# Patient Record
Sex: Male | Born: 1970
Health system: Southern US, Community
[De-identification: ages and names within clinical notes are randomized; demographics above are authoritative.]

## PROBLEM LIST (undated history)

## (undated) DIAGNOSIS — G40909 Epilepsy, unspecified, not intractable, without status epilepticus: Secondary | ICD-10-CM

## (undated) DIAGNOSIS — I10 Essential (primary) hypertension: Secondary | ICD-10-CM

## (undated) DIAGNOSIS — F1990 Other psychoactive substance use, unspecified, uncomplicated: Secondary | ICD-10-CM

## (undated) DIAGNOSIS — F1090 Alcohol use, unspecified, uncomplicated: Secondary | ICD-10-CM

---

## 1999-06-08 ENCOUNTER — Emergency Department (HOSPITAL_COMMUNITY): Admission: EM | Admit: 1999-06-08 | Discharge: 1999-06-08 | Payer: Self-pay | Admitting: Emergency Medicine

## 1999-06-08 ENCOUNTER — Encounter: Payer: Self-pay | Admitting: Emergency Medicine

## 1999-09-11 ENCOUNTER — Emergency Department (HOSPITAL_COMMUNITY): Admission: EM | Admit: 1999-09-11 | Discharge: 1999-09-11 | Payer: Self-pay | Admitting: Emergency Medicine

## 1999-09-11 ENCOUNTER — Encounter: Payer: Self-pay | Admitting: Emergency Medicine

## 2008-01-02 ENCOUNTER — Emergency Department (HOSPITAL_COMMUNITY): Admission: EM | Admit: 2008-01-02 | Discharge: 2008-01-02 | Payer: Self-pay | Admitting: Emergency Medicine

## 2008-01-20 ENCOUNTER — Emergency Department (HOSPITAL_COMMUNITY): Admission: EM | Admit: 2008-01-20 | Discharge: 2008-01-20 | Payer: Self-pay | Admitting: Emergency Medicine

## 2008-01-27 ENCOUNTER — Emergency Department (HOSPITAL_COMMUNITY): Admission: EM | Admit: 2008-01-27 | Discharge: 2008-01-28 | Payer: Self-pay | Admitting: Emergency Medicine

## 2008-02-01 ENCOUNTER — Emergency Department (HOSPITAL_COMMUNITY): Admission: EM | Admit: 2008-02-01 | Discharge: 2008-02-01 | Payer: Self-pay | Admitting: Emergency Medicine

## 2009-10-02 ENCOUNTER — Emergency Department (HOSPITAL_COMMUNITY): Admission: EM | Admit: 2009-10-02 | Discharge: 2009-10-03 | Payer: Self-pay | Admitting: Emergency Medicine

## 2009-10-08 ENCOUNTER — Emergency Department (HOSPITAL_COMMUNITY): Admission: EM | Admit: 2009-10-08 | Discharge: 2009-10-08 | Payer: Self-pay | Admitting: Emergency Medicine

## 2009-10-11 ENCOUNTER — Emergency Department (HOSPITAL_COMMUNITY): Admission: EM | Admit: 2009-10-11 | Discharge: 2009-10-11 | Payer: Self-pay | Admitting: Emergency Medicine

## 2010-11-11 ENCOUNTER — Emergency Department (HOSPITAL_COMMUNITY): Payer: Self-pay

## 2010-11-11 ENCOUNTER — Emergency Department (HOSPITAL_COMMUNITY)
Admission: EM | Admit: 2010-11-11 | Discharge: 2010-11-11 | Disposition: A | Discharge status: Home / Self Care | Payer: Self-pay | Attending: Emergency Medicine | Admitting: Emergency Medicine

## 2010-11-11 DIAGNOSIS — M25559 Pain in unspecified hip: Secondary | ICD-10-CM | POA: Insufficient documentation

## 2010-11-11 DIAGNOSIS — S5000XA Contusion of unspecified elbow, initial encounter: Secondary | ICD-10-CM | POA: Insufficient documentation

## 2010-11-11 DIAGNOSIS — M25529 Pain in unspecified elbow: Secondary | ICD-10-CM | POA: Insufficient documentation

## 2010-11-11 DIAGNOSIS — R079 Chest pain, unspecified: Secondary | ICD-10-CM | POA: Insufficient documentation

## 2010-11-11 DIAGNOSIS — S20219A Contusion of unspecified front wall of thorax, initial encounter: Secondary | ICD-10-CM | POA: Insufficient documentation

## 2010-11-11 DIAGNOSIS — IMO0002 Reserved for concepts with insufficient information to code with codable children: Secondary | ICD-10-CM | POA: Insufficient documentation

## 2010-11-11 DIAGNOSIS — S7000XA Contusion of unspecified hip, initial encounter: Secondary | ICD-10-CM | POA: Insufficient documentation

## 2011-03-07 LAB — POCT I-STAT, CHEM 8
BUN: 14
Calcium, Ion: 1.18
Chloride: 105
Creatinine, Ser: 1.1
Sodium: 138
TCO2: 25

## 2011-03-07 LAB — URINALYSIS, ROUTINE W REFLEX MICROSCOPIC
Ketones, ur: 15 — AB
Leukocytes, UA: NEGATIVE
Nitrite: NEGATIVE
Protein, ur: 30 — AB

## 2017-10-30 DIAGNOSIS — R569 Unspecified convulsions: Secondary | ICD-10-CM | POA: Insufficient documentation

## 2017-11-03 ENCOUNTER — Other Ambulatory Visit: Payer: Self-pay | Admitting: Acute Care

## 2017-11-03 DIAGNOSIS — R569 Unspecified convulsions: Secondary | ICD-10-CM

## 2017-11-13 ENCOUNTER — Other Ambulatory Visit: Payer: Self-pay

## 2022-01-23 ENCOUNTER — Other Ambulatory Visit: Payer: Self-pay

## 2022-01-23 ENCOUNTER — Emergency Department (HOSPITAL_COMMUNITY)
Admission: EM | Admit: 2022-01-23 | Discharge: 2022-01-23 | Disposition: A | Discharge status: Home / Self Care | Payer: Self-pay | Attending: Emergency Medicine | Admitting: Emergency Medicine

## 2022-01-23 ENCOUNTER — Encounter (HOSPITAL_COMMUNITY): Payer: Self-pay

## 2022-01-23 ENCOUNTER — Emergency Department (HOSPITAL_COMMUNITY): Payer: Self-pay

## 2022-01-23 DIAGNOSIS — S80212A Abrasion, left knee, initial encounter: Secondary | ICD-10-CM | POA: Insufficient documentation

## 2022-01-23 DIAGNOSIS — Z5321 Procedure and treatment not carried out due to patient leaving prior to being seen by health care provider: Secondary | ICD-10-CM | POA: Insufficient documentation

## 2022-01-23 DIAGNOSIS — S0181XA Laceration without foreign body of other part of head, initial encounter: Secondary | ICD-10-CM | POA: Insufficient documentation

## 2022-01-23 MED ORDER — TETANUS-DIPHTH-ACELL PERTUSSIS 5-2.5-18.5 LF-MCG/0.5 IM SUSY: 0.5000 mL | PREFILLED_SYRINGE | Freq: Once | INTRAMUSCULAR | Status: DC
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
## 2022-01-23 NOTE — ED Notes (Signed)
Pt calling staff names.
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
## 2022-01-23 NOTE — ED Notes (Signed)
CT come to waiting room to get patient and patient asked for a stretcher saying he cant walk. Explained to patient that we don't have a stretcher to take him to CT so patient gets up and walks to hallway of CT and lays in floor and refuses to get up while verbally abusing staff calling them names. Patient then gets up and says he is leaving.

## 2022-10-16 ENCOUNTER — Other Ambulatory Visit: Payer: Self-pay

## 2022-10-16 ENCOUNTER — Emergency Department (HOSPITAL_COMMUNITY): Payer: 59

## 2022-10-16 ENCOUNTER — Emergency Department (HOSPITAL_COMMUNITY)
Admission: EM | Admit: 2022-10-16 | Discharge: 2022-10-16 | Disposition: A | Discharge status: Home / Self Care | Payer: 59 | Attending: Student in an Organized Health Care Education/Training Program | Admitting: Student in an Organized Health Care Education/Training Program

## 2022-10-16 DIAGNOSIS — S43101A Unspecified dislocation of right acromioclavicular joint, initial encounter: Secondary | ICD-10-CM | POA: Diagnosis not present

## 2022-10-16 DIAGNOSIS — M25511 Pain in right shoulder: Secondary | ICD-10-CM | POA: Insufficient documentation

## 2022-10-16 DIAGNOSIS — R0781 Pleurodynia: Secondary | ICD-10-CM | POA: Diagnosis not present

## 2022-10-16 DIAGNOSIS — S0081XA Abrasion of other part of head, initial encounter: Secondary | ICD-10-CM | POA: Insufficient documentation

## 2022-10-16 DIAGNOSIS — F1010 Alcohol abuse, uncomplicated: Secondary | ICD-10-CM

## 2022-10-16 DIAGNOSIS — S4991XA Unspecified injury of right shoulder and upper arm, initial encounter: Secondary | ICD-10-CM | POA: Diagnosis present

## 2022-10-16 DIAGNOSIS — Y9355 Activity, bike riding: Secondary | ICD-10-CM | POA: Insufficient documentation

## 2022-10-16 DIAGNOSIS — R55 Syncope and collapse: Secondary | ICD-10-CM | POA: Diagnosis not present

## 2022-10-16 DIAGNOSIS — S30811A Abrasion of abdominal wall, initial encounter: Secondary | ICD-10-CM | POA: Diagnosis not present

## 2022-10-16 DIAGNOSIS — S0990XA Unspecified injury of head, initial encounter: Secondary | ICD-10-CM | POA: Insufficient documentation

## 2022-10-16 DIAGNOSIS — W19XXXA Unspecified fall, initial encounter: Secondary | ICD-10-CM

## 2022-10-16 LAB — COMPREHENSIVE METABOLIC PANEL
ALT: 23 U/L (ref 0–44)
AST: 22 U/L (ref 15–41)
Albumin: 4.1 g/dL (ref 3.5–5.0)
Alkaline Phosphatase: 75 U/L (ref 38–126)
Anion gap: 8 (ref 5–15)
BUN: 14 mg/dL (ref 6–20)
CO2: 24 mmol/L (ref 22–32)
Calcium: 9 mg/dL (ref 8.9–10.3)
Chloride: 106 mmol/L (ref 98–111)
Creatinine, Ser: 0.88 mg/dL (ref 0.61–1.24)
GFR, Estimated: >60 mL/min (ref >60)
Glucose, Bld: 123 mg/dL — ABNORMAL HIGH (ref 70–99)
Potassium: 3.9 mmol/L (ref 3.5–5.1)
Sodium: 138 mmol/L (ref 135–145)
Total Bilirubin: 0.5 mg/dL (ref 0.3–1.2)
Total Protein: 7.1 g/dL (ref 6.5–8.1)

## 2022-10-16 LAB — CBC WITH DIFFERENTIAL/PLATELET
Abs Immature Granulocytes: 0.03 10*3/uL (ref 0.00–0.07)
Basophils Absolute: 0.1 10*3/uL (ref 0.0–0.1)
Basophils Relative: 1 %
Eosinophils Absolute: 0.1 10*3/uL (ref 0.0–0.5)
Eosinophils Relative: 1 %
HCT: 43 % (ref 39.0–52.0)
Hemoglobin: 13.9 g/dL (ref 13.0–17.0)
Immature Granulocytes: 0 %
Lymphocytes Relative: 15 %
Lymphs Abs: 1.6 10*3/uL (ref 0.7–4.0)
MCH: 28.5 pg (ref 26.0–34.0)
MCHC: 32.3 g/dL (ref 30.0–36.0)
MCV: 88.1 fL (ref 80.0–100.0)
Monocytes Absolute: 0.9 10*3/uL (ref 0.1–1.0)
Monocytes Relative: 9 %
Neutro Abs: 7.6 10*3/uL (ref 1.7–7.7)
Neutrophils Relative %: 74 %
Platelets: 117 10*3/uL — ABNORMAL LOW (ref 150–400)
RBC: 4.88 MIL/uL (ref 4.22–5.81)
RDW: 13.7 % (ref 11.5–15.5)
WBC: 10.4 10*3/uL (ref 4.0–10.5)
nRBC: 0 % (ref 0.0–0.2)

## 2022-10-16 LAB — LIPASE, BLOOD: Lipase: 42 U/L (ref 11–51)

## 2022-10-16 LAB — URINALYSIS, ROUTINE W REFLEX MICROSCOPIC
Bilirubin Urine: NEGATIVE
Glucose, UA: NEGATIVE mg/dL
Hgb urine dipstick: NEGATIVE
Ketones, ur: NEGATIVE mg/dL
Leukocytes,Ua: NEGATIVE
Nitrite: NEGATIVE
Protein, ur: NEGATIVE mg/dL
Specific Gravity, Urine: 1.017 (ref 1.005–1.030)
pH: 7 (ref 5.0–8.0)

## 2022-10-16 MED ORDER — LACTATED RINGERS IV BOLUS
1000.0000 mL | Freq: Once | INTRAVENOUS | Status: AC
  Administered 2022-10-16: 1000 mL via INTRAVENOUS

## 2022-10-16 MED ORDER — MORPHINE SULFATE (PF) 4 MG/ML IV SOLN
4.0000 mg | Freq: Once | INTRAVENOUS | Status: AC
  Administered 2022-10-16: 4 mg via INTRAVENOUS
  Filled 2022-10-16: qty 1

## 2022-10-16 MED ORDER — KETOROLAC TROMETHAMINE 15 MG/ML IJ SOLN
15.0000 mg | Freq: Once | INTRAMUSCULAR | Status: AC
  Administered 2022-10-16: 15 mg via INTRAVENOUS
  Filled 2022-10-16: qty 1
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
## 2022-10-16 NOTE — Discharge Instructions (Addendum)
Continue wearing the sling until you are cleared by your primary care physician or orthopedic physician.  We have referred you to orthopedic physician.  You can call them to schedule an appointment for reevaluation as needed.  You can take Tylenol for pain.  We recommend avoiding strenuous activity while your shoulder heals.  Return to emergency department if you develop any worsening pain, shortness of breath, persistent abdominal pain, or any other concerning symptoms.

## 2023-01-28 ENCOUNTER — Emergency Department (HOSPITAL_COMMUNITY): Payer: 59

## 2023-01-28 ENCOUNTER — Other Ambulatory Visit: Payer: Self-pay

## 2023-01-28 ENCOUNTER — Emergency Department (HOSPITAL_COMMUNITY)
Admission: EM | Admit: 2023-01-28 | Discharge: 2023-01-29 | Disposition: A | Discharge status: Home / Self Care | Payer: 59 | Attending: Emergency Medicine | Admitting: Emergency Medicine

## 2023-01-28 DIAGNOSIS — R569 Unspecified convulsions: Secondary | ICD-10-CM

## 2023-01-28 DIAGNOSIS — S0012XA Contusion of left eyelid and periocular area, initial encounter: Secondary | ICD-10-CM | POA: Diagnosis not present

## 2023-01-28 DIAGNOSIS — R000 Tachycardia, unspecified: Secondary | ICD-10-CM | POA: Diagnosis not present

## 2023-01-28 DIAGNOSIS — S0591XA Unspecified injury of right eye and orbit, initial encounter: Secondary | ICD-10-CM | POA: Diagnosis present

## 2023-01-28 DIAGNOSIS — S0990XA Unspecified injury of head, initial encounter: Secondary | ICD-10-CM | POA: Diagnosis not present

## 2023-01-28 DIAGNOSIS — S0011XA Contusion of right eyelid and periocular area, initial encounter: Secondary | ICD-10-CM | POA: Insufficient documentation

## 2023-01-28 DIAGNOSIS — R451 Restlessness and agitation: Secondary | ICD-10-CM | POA: Diagnosis not present

## 2023-01-28 LAB — CBC WITH DIFFERENTIAL/PLATELET
Abs Immature Granulocytes: 0.08 10*3/uL — ABNORMAL HIGH (ref 0.00–0.07)
Basophils Absolute: 0.1 10*3/uL (ref 0.0–0.1)
Basophils Relative: 0 %
Eosinophils Absolute: 0 10*3/uL (ref 0.0–0.5)
Eosinophils Relative: 0 %
HCT: 48 % (ref 39.0–52.0)
Hemoglobin: 15.5 g/dL (ref 13.0–17.0)
Immature Granulocytes: 1 %
Lymphocytes Relative: 6 %
Lymphs Abs: 0.8 10*3/uL (ref 0.7–4.0)
MCH: 28.3 pg (ref 26.0–34.0)
MCHC: 32.3 g/dL (ref 30.0–36.0)
MCV: 87.6 fL (ref 80.0–100.0)
Monocytes Absolute: 0.7 10*3/uL (ref 0.1–1.0)
Monocytes Relative: 5 %
Neutro Abs: 12.1 10*3/uL — ABNORMAL HIGH (ref 1.7–7.7)
Neutrophils Relative %: 88 %
Platelets: 120 10*3/uL — ABNORMAL LOW (ref 150–400)
RBC: 5.48 MIL/uL (ref 4.22–5.81)
RDW: 14.5 % (ref 11.5–15.5)
WBC: 13.8 10*3/uL — ABNORMAL HIGH (ref 4.0–10.5)
nRBC: 0 % (ref 0.0–0.2)

## 2023-01-28 LAB — COMPREHENSIVE METABOLIC PANEL
ALT: 21 U/L (ref 0–44)
AST: 30 U/L (ref 15–41)
Albumin: 4.1 g/dL (ref 3.5–5.0)
Alkaline Phosphatase: 120 U/L (ref 38–126)
Anion gap: 16 — ABNORMAL HIGH (ref 5–15)
BUN: 9 mg/dL (ref 6–20)
CO2: 18 mmol/L — ABNORMAL LOW (ref 22–32)
Calcium: 8.9 mg/dL (ref 8.9–10.3)
Chloride: 103 mmol/L (ref 98–111)
Creatinine, Ser: 1.03 mg/dL (ref 0.61–1.24)
GFR, Estimated: >60 mL/min (ref >60)
Glucose, Bld: 135 mg/dL — ABNORMAL HIGH (ref 70–99)
Potassium: 3.5 mmol/L (ref 3.5–5.1)
Sodium: 137 mmol/L (ref 135–145)
Total Bilirubin: 0.8 mg/dL (ref 0.3–1.2)
Total Protein: 7.3 g/dL (ref 6.5–8.1)

## 2023-01-28 LAB — ETHANOL: Alcohol, Ethyl (B): <10 mg/dL (ref <10)

## 2023-01-28 MED ORDER — DIAZEPAM 5 MG/ML IJ SOLN
5.0000 mg | Freq: Once | INTRAMUSCULAR | Status: AC
  Administered 2023-01-28: 5 mg via INTRAVENOUS
  Filled 2023-01-28: qty 2

## 2023-01-28 MED ORDER — DIPHENHYDRAMINE HCL 50 MG/ML IJ SOLN
25.0000 mg | Freq: Once | INTRAMUSCULAR | Status: AC
  Administered 2023-01-28: 25 mg via INTRAVENOUS
  Filled 2023-01-28: qty 1

## 2023-01-28 MED ORDER — HALOPERIDOL LACTATE 5 MG/ML IJ SOLN
5.0000 mg | Freq: Once | INTRAMUSCULAR | Status: AC
  Administered 2023-01-28: 5 mg via INTRAVENOUS
  Filled 2023-01-28: qty 1

## 2023-01-28 MED ORDER — ACETAMINOPHEN 500 MG PO TABS
ORAL_TABLET | ORAL | Status: AC
  Filled 2023-01-28: qty 1
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
## 2023-01-28 NOTE — ED Notes (Signed)
Pt continues to pull off monitoring.  Pt is making purposeful movements and actions.  Pt continues to complain of head pain and nausea.  Pt continues to thrash around the bed.  EDP made aware.

## 2023-01-28 NOTE — Discharge Instructions (Signed)
Please follow-up with your neurologist in regards to recent symptoms and ER visit.  Today your lab workup and imaging were reassuring however you will need to continue taking your seizure medications to prevent future seizures.  If symptoms change or worsen please return to ER.

## 2023-01-28 NOTE — ED Notes (Signed)
PT given 500mg  of Tylenol for severe headache.Medication was overidden from pyxis.

## 2023-01-28 NOTE — ED Notes (Signed)
Patient transported to CT 

## 2023-01-28 NOTE — ED Notes (Addendum)
PT is agitated, attempted to talk to him to calm him down and he yelled. He stated that his head is hurt and he needed tylenol so the PA Schuman ordered 500mg  Tylenol verbally.
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
## 2023-01-28 NOTE — ED Notes (Signed)
CT declined to take PT down because he was agitated and wont stop moving around. They said they will be back later to check to see if he's better

## 2023-01-29 ENCOUNTER — Encounter (HOSPITAL_COMMUNITY): Payer: Self-pay

## 2023-02-03 ENCOUNTER — Other Ambulatory Visit: Payer: Self-pay

## 2023-02-03 ENCOUNTER — Emergency Department (HOSPITAL_COMMUNITY)
Admission: EM | Admit: 2023-02-03 | Discharge: 2023-02-04 | Disposition: A | Discharge status: Home / Self Care | Payer: 59 | Attending: Emergency Medicine | Admitting: Emergency Medicine

## 2023-02-03 ENCOUNTER — Encounter (HOSPITAL_COMMUNITY): Payer: Self-pay

## 2023-02-03 DIAGNOSIS — F1090 Alcohol use, unspecified, uncomplicated: Secondary | ICD-10-CM | POA: Diagnosis not present

## 2023-02-03 DIAGNOSIS — Y906 Blood alcohol level of 120-199 mg/100 ml: Secondary | ICD-10-CM | POA: Diagnosis not present

## 2023-02-03 DIAGNOSIS — Z789 Other specified health status: Secondary | ICD-10-CM

## 2023-02-03 DIAGNOSIS — F419 Anxiety disorder, unspecified: Secondary | ICD-10-CM | POA: Insufficient documentation

## 2023-02-03 DIAGNOSIS — D696 Thrombocytopenia, unspecified: Secondary | ICD-10-CM | POA: Diagnosis not present

## 2023-02-03 DIAGNOSIS — R1011 Right upper quadrant pain: Secondary | ICD-10-CM | POA: Insufficient documentation

## 2023-02-03 MED ORDER — ONDANSETRON 4 MG PO TBDP
4.0000 mg | ORAL_TABLET | Freq: Once | ORAL | Status: AC | PRN
  Administered 2023-02-03: 4 mg via ORAL
  Filled 2023-02-03: qty 1

## 2023-02-03 NOTE — ED Triage Notes (Signed)
Patient arrives via EMS. Patient was found running around [place] stating he needs help. When [employer] arrived patient was complaining of abdominal pain on the right side for 7 days. Reports being constipated but had a bowel movement today. Patient has been taking Miralax.

## 2023-02-04 ENCOUNTER — Emergency Department (HOSPITAL_COMMUNITY): Payer: 59

## 2023-02-04 LAB — CBC
HCT: 44.5 % (ref 39.0–52.0)
Hemoglobin: 14.2 g/dL (ref 13.0–17.0)
MCH: 28 pg (ref 26.0–34.0)
MCHC: 31.9 g/dL (ref 30.0–36.0)
MCV: 87.8 fL (ref 80.0–100.0)
Platelets: 107 10*3/uL — ABNORMAL LOW (ref 150–400)
RBC: 5.07 MIL/uL (ref 4.22–5.81)
RDW: 14.6 % (ref 11.5–15.5)
WBC: 8.4 10*3/uL (ref 4.0–10.5)
nRBC: 0 % (ref 0.0–0.2)

## 2023-02-04 LAB — COMPREHENSIVE METABOLIC PANEL
ALT: 19 U/L (ref 0–44)
AST: 21 U/L (ref 15–41)
Albumin: 3.6 g/dL (ref 3.5–5.0)
Alkaline Phosphatase: 107 U/L (ref 38–126)
Anion gap: 10 (ref 5–15)
BUN: 12 mg/dL (ref 6–20)
CO2: 26 mmol/L (ref 22–32)
Calcium: 8.7 mg/dL — ABNORMAL LOW (ref 8.9–10.3)
Chloride: 106 mmol/L (ref 98–111)
Creatinine, Ser: 1.06 mg/dL (ref 0.61–1.24)
GFR, Estimated: >60 mL/min (ref >60)
Glucose, Bld: 109 mg/dL — ABNORMAL HIGH (ref 70–99)
Potassium: 3.6 mmol/L (ref 3.5–5.1)
Sodium: 142 mmol/L (ref 135–145)
Total Bilirubin: 0.6 mg/dL (ref 0.3–1.2)
Total Protein: 6.5 g/dL (ref 6.5–8.1)

## 2023-02-04 LAB — ETHANOL: Alcohol, Ethyl (B): 131 mg/dL — ABNORMAL HIGH (ref <10)

## 2023-02-04 LAB — LIPASE, BLOOD: Lipase: 40 U/L (ref 11–51)

## 2023-02-04 MED ORDER — FENTANYL CITRATE PF 50 MCG/ML IJ SOSY
50.0000 ug | PREFILLED_SYRINGE | Freq: Once | INTRAMUSCULAR | Status: AC
  Administered 2023-02-04: 50 ug via INTRAVENOUS
  Filled 2023-02-04: qty 1

## 2023-02-04 MED ORDER — KETOROLAC TROMETHAMINE 15 MG/ML IJ SOLN
15.0000 mg | Freq: Once | INTRAMUSCULAR | Status: AC
  Administered 2023-02-04: 15 mg via INTRAMUSCULAR
  Filled 2023-02-04: qty 1

## 2023-02-04 MED ORDER — SODIUM CHLORIDE 0.9 % IV BOLUS (SEPSIS)
1000.0000 mL | Freq: Once | INTRAVENOUS | Status: AC
  Administered 2023-02-04: 1000 mL via INTRAVENOUS

## 2023-02-04 MED ORDER — IOHEXOL 350 MG/ML SOLN
75.0000 mL | Freq: Once | INTRAVENOUS | Status: AC | PRN
  Administered 2023-02-04: 75 mL via INTRAVENOUS

## 2023-02-04 NOTE — ED Notes (Signed)
Urinal given and instructed to use when can void

## 2023-02-04 NOTE — ED Notes (Signed)
Patient transported to CT 

## 2023-02-04 NOTE — Discharge Instructions (Signed)
Abdominal (belly) pain can be caused by many things. any cases can be observed and treated at home after initial evaluation in the emergency department. Even though you are being discharged home, abdominal pain can be unpredictable. Therefore, you need a repeated exam if your pain does not resolve, returns, or worsens. Most patients with abdominal pain don't have to be admitted to the hospital or have surgery, but serious problems like appendicitis and gallbladder attacks can start out as nonspecific pain. Many abdominal conditions cannot be diagnosed in one visit, so follow-up evaluations are very important. °SEEK IMMEDIATE MEDICAL ATTENTION IF: °The pain does not go away or becomes severe, particularly over the next 8-12 hours.  °A temperature above 100.4F develops.  °Repeated vomiting occurs (multiple episodes).  °The pain becomes localized to portions of the abdomen. The right side could possibly be appendicitis. In an adult, the left lower portion of the abdomen could be colitis or diverticulitis.  °Blood is being passed in stools or vomit (bright red or black tarry stools).  °Return also if you develop chest pain, difficulty breathing, dizziness or fainting, or become confused, poorly responsive, or inconsolable. ° °

## 2023-02-04 NOTE — ED Notes (Signed)
Patient transported to Ultrasound 

## 2023-02-04 NOTE — ED Notes (Signed)
Pt making provocative comments towards this Clinical research associate and Kathryne Hitch, Charity fundraiser. Pt was asked to provide a urine sample and did not want to provide on in the restroom. Instead, the pt screams at this writer " I don't need to go the bathroom, I will pee right here!!" And pulls his pants down. Pt was told to pull his pants back up and security was called.

## 2023-02-04 NOTE — ED Provider Triage Note (Signed)
Emergency Medicine Provider Triage Evaluation Note  NYAIR SAMPAIO , a 52 y.o. male  was evaluated in triage.  Pt complains of RUQ pain. States that pain is constant. He has tried stool softeners x 2 w/o relief. Endorses constipation with last BM 2 weeks ago, but cannot tell me if he is passing gas or not. Denies N/V, urinary symptoms. Last ETOH intake was today. Uses methamphetamines and cocaine. No prior abdominal surgeries. Denies trauma.  Review of Systems  Positive: As above Negative: As above  Physical Exam  BP (!) 180/109 (BP Location: Right Arm)   Pulse 61   Temp 98.1 F (36.7 C)   Resp 15   SpO2 97%  Gen:   Awake, no distress   Resp:  Normal effort  MSK:   Moves extremities without difficulty  Other:  Agitated with staff. TTP in the RUQ with neg Murphy's sign. No peritoneal signs on exam.  Medical Decision Making  Medically screening exam initiated at 12:11 AM.  Appropriate orders placed.  Harrington Tatom Pelaez was informed that the remainder of the evaluation will be completed by another provider, this initial triage assessment does not replace that evaluation, and the importance of remaining in the ED until their evaluation is complete.  Abdominal pain - work up initiated.   Antony Madura, PA-C 02/04/23 [phone]

## 2023-02-04 NOTE — ED Provider Notes (Signed)
[place] EMERGENCY DEPARTMENT AT [facility] Provider Note   CSN: [id number] Arrival date & time: 02/03/23  2333     History  Chief Complaint  Patient presents with   Abdominal Pain    Timothy Montgomery is a 52 y.o. male.  The history is provided by the patient.      Patient presents with abdominal pain.  Patient reports has had right upper quadrant abdominal pain for the past 2 days.  He also reports constipation.  No fevers or vomiting.  No active chest pain.  He does not recall having this previously.  No urinary symptoms.  No previous abdominal surgery Patient admits to alcohol use and other substances  Home Medications Prior to Admission medications   Not on File      Allergies    Patient has no known allergies.    Review of Systems   Review of Systems  Constitutional:  Negative for fever.  Gastrointestinal:  Positive for abdominal pain.    Physical Exam Updated Vital Signs BP (!) 157/90 (BP Location: Right Arm)   Pulse 66   Temp 98.3 F (36.8 C) (Oral)   Resp 18   SpO2 100%  Physical Exam CONSTITUTIONAL: Disheveled, smells of alcohol HEAD: Normocephalic/atraumatic EYES: EOMI/PERRL, no icterus ENMT: Mucous membranes moist NECK: supple no meningeal signs CV: S1/S2 noted, no murmurs/rubs/gallops noted LUNGS: Lungs are clear to auscultation bilaterally, no apparent distress ABDOMEN: soft, moderate RUQ tenderness, no rebound or guarding, bowel sounds noted throughout abdomen GU:no cva tenderness NEURO: Pt is awake/alert/appropriate, moves all extremitiesx4.  No facial droop.   EXTREMITIES: pulses normal/equal, full ROM SKIN: warm, color normal PSYCH: Anxious  ED Results / Procedures / Treatments   Labs (all labs ordered are listed, but only abnormal results are displayed) Labs Reviewed  COMPREHENSIVE METABOLIC PANEL - Abnormal; Notable for the following components:      Result Value   Glucose, Bld 109 (*)    Calcium 8.7 (*)    All  other components within normal limits  CBC - Abnormal; Notable for the following components:   Platelets 107 (*)    All other components within normal limits  ETHANOL - Abnormal; Notable for the following components:   Alcohol, Ethyl (B) 131 (*)    All other components within normal limits  LIPASE, BLOOD    EKG EKG Interpretation Date/Time:  Wednesday February 04 2023 03:13:36 EDT Ventricular Rate:  57 PR Interval:  141 QRS Duration:  91 QT Interval:  461 QTC Calculation: 449 R Axis:   68  Text Interpretation: Sinus rhythm Probable left atrial enlargement ST elev, probable normal early repol pattern No previous ECGs available Confirmed by Zadie Rhine ([id number]) on 02/04/2023 3:30:07 AM  Radiology US Abdomen Limited RUQ (LIVER/GB)  Result Date: 02/04/2023 CLINICAL DATA:  Right upper quadrant pain EXAM: ULTRASOUND ABDOMEN LIMITED RIGHT UPPER QUADRANT COMPARISON:  CT from earlier in the same day. FINDINGS: Gallbladder: No gallstones or wall thickening visualized. No sonographic Murphy sign noted by sonographer. Common bile duct: Diameter: 2.8 mm Liver: No focal lesion identified. Within normal limits in parenchymal echogenicity. Portal vein is patent on color Doppler imaging with normal direction of blood flow towards the liver. Other: None. IMPRESSION: No acute abnormality noted. Electronically Signed   By: Alcide Clever M.D.   On: 02/04/2023 03:06   CT ABDOMEN PELVIS W CONTRAST  Result Date: 02/04/2023 CLINICAL DATA:  Right-sided abdominal pain. EXAM: CT ABDOMEN AND PELVIS WITH CONTRAST TECHNIQUE: Multidetector CT imaging of  the abdomen and pelvis was performed using the standard protocol following bolus administration of intravenous contrast. RADIATION DOSE REDUCTION: This exam was performed according to the departmental dose-optimization program which includes automated exposure control, adjustment of the mA and/or kV according to patient size and/or use of iterative reconstruction  technique. CONTRAST:  75mL OMNIPAQUE IOHEXOL 350 MG/ML SOLN COMPARISON:  January 02, 2008 FINDINGS: Lower chest: No acute abnormality. Hepatobiliary: No focal liver abnormality is seen. No gallstones, gallbladder wall thickening, or biliary dilatation. Pancreas: Unremarkable. No pancreatic ductal dilatation or surrounding inflammatory changes. Spleen: A 10 mm diameter cystic appearing structure is seen within an otherwise normal-appearing spleen (axial CT image 14, CT series 3). Adrenals/Urinary Tract: Adrenal glands are unremarkable. Kidneys are normal in size, without renal calculi or hydronephrosis. A 7 mm simple cyst is seen within the posterior aspect of the mid left kidney. Bladder is unremarkable. Stomach/Bowel: Stomach is within normal limits. Appendix appears normal. No evidence of bowel dilatation. The sigmoid colon is moderately thickened and poorly distended. Mild thickening of the distal descending colon is also seen. Vascular/Lymphatic: Aortic atherosclerosis. No enlarged abdominal or pelvic lymph nodes. Reproductive: The prostate gland is mildly enlarged. Other: No abdominal wall hernia or abnormality. No abdominopelvic ascites. Musculoskeletal: No acute or significant osseous findings. IMPRESSION: 1. Thickening of the sigmoid colon and distal descending colon, which may be, in part secondary to underdistention. Colitis within these regions can not be excluded. 2. Small splenic cyst. 3. Small left renal cyst. No follow-up imaging is recommended. This recommendation follows ACR consensus guidelines: Management of the Incidental Renal Mass on CT: A White Paper of the ACR Incidental Findings Committee. J Am Coll Radiol [phone]. 4. Aortic atherosclerosis. Aortic Atherosclerosis (ICD10-I70.0). Electronically Signed   By: Aram Candela M.D.   On: 02/04/2023 02:18    Procedures Procedures    Medications Ordered in ED Medications  ondansetron (ZOFRAN-ODT) disintegrating tablet 4 mg (4 mg Oral  Given 02/03/23 2342)  ketorolac (TORADOL) 15 MG/ML injection 15 mg (15 mg Intramuscular Given 02/04/23 0015)  fentaNYL (SUBLIMAZE) injection 50 mcg (50 mcg Intravenous Given 02/04/23 0229)  sodium chloride 0.9 % bolus 1,000 mL (0 mLs Intravenous Stopped 02/04/23 0304)  iohexol (OMNIPAQUE) 350 MG/ML injection 75 mL (75 mLs Intravenous Contrast Given 02/04/23 0208)    ED Course/ Medical Decision Making/ A&P Clinical Course as of 02/04/23 0413  Wed Feb 04, 2023  0412 Platelets(!): 107 Chronic thrombocytopenia [DW]  [phone] Patient presented for abdominal pain worse in the right quadrant.  Extensive workup has been unrevealing.  Low suspicion for colitis given his history.  Most of his pain was located right upper quadrant.  Patient was also intoxicated but that is improving. Patient is safe for discharge, reports his mother will pick him up [DW]    Clinical Course User Index [DW] Zadie Rhine, MD                                 Medical Decision Making Amount and/or Complexity of Data Reviewed Labs: ordered. Radiology: ordered. ECG/medicine tests: ordered.  Risk Prescription drug management.   This patient presents to the ED for concern of abdominal pain, this involves an extensive number of treatment options, and is a complaint that carries with it a high risk of complications and morbidity.  The differential diagnosis includes but is not limited to cholecystitis, cholelithiasis, pancreatitis, gastritis, peptic ulcer disease, appendicitis, bowel obstruction, bowel perforation, diverticulitis, AAA, ischemic  bowel    Comorbidities that complicate the patient evaluation: Patient's presentation is complicated by their history of alcohol use  Social Determinants of Health: Patient's  substance use disorder   increases the complexity of managing their presentation  Additional history obtained: Records reviewed Care Everywhere/External Records  Lab Tests: I Ordered, and personally  interpreted labs.  The pertinent results include: Chronic thrombocytopenia, alcohol intoxication  Imaging Studies ordered: I ordered imaging studies including CT scan abdomen pelvis and Ultrasound right upper quadrant   I independently visualized and interpreted imaging which showed no acute findings I agree with the radiologist interpretation   Medicines ordered and prescription drug management: I ordered medication including fentanyl for pain Reevaluation of the patient after these medicines showed that the patient    improved  Reevaluation: After the interventions noted above, I reevaluated the patient and found that they have :improved  Complexity of problems addressed: Patient's presentation is most consistent with  acute presentation with potential threat to life or bodily function  Disposition: After consideration of the diagnostic results and the patient's response to treatment,  I feel that the patent would benefit from discharge   .           Final Clinical Impression(s) / ED Diagnoses Final diagnoses:  Right upper quadrant abdominal pain  Alcohol use    Rx / DC Orders ED Discharge Orders     None         Zadie Rhine, MD 02/04/23 [phone]

## 2023-02-06 ENCOUNTER — Other Ambulatory Visit: Payer: Self-pay

## 2023-02-06 ENCOUNTER — Emergency Department (HOSPITAL_COMMUNITY)
Admission: EM | Admit: 2023-02-06 | Discharge: 2023-02-06 | Disposition: A | Discharge status: Home / Self Care | Payer: 59 | Attending: Emergency Medicine | Admitting: Emergency Medicine

## 2023-02-06 ENCOUNTER — Encounter (HOSPITAL_COMMUNITY): Payer: Self-pay

## 2023-02-06 DIAGNOSIS — T7840XA Allergy, unspecified, initial encounter: Secondary | ICD-10-CM | POA: Insufficient documentation

## 2023-02-06 DIAGNOSIS — D72829 Elevated white blood cell count, unspecified: Secondary | ICD-10-CM | POA: Diagnosis not present

## 2023-02-06 LAB — BASIC METABOLIC PANEL
Anion gap: 13 (ref 5–15)
BUN: 19 mg/dL (ref 6–20)
CO2: 22 mmol/L (ref 22–32)
Calcium: 9.2 mg/dL (ref 8.9–10.3)
Chloride: 106 mmol/L (ref 98–111)
Creatinine, Ser: 1.23 mg/dL (ref 0.61–1.24)
GFR, Estimated: >60 mL/min (ref >60)
Glucose, Bld: 108 mg/dL — ABNORMAL HIGH (ref 70–99)
Potassium: 3.6 mmol/L (ref 3.5–5.1)
Sodium: 141 mmol/L (ref 135–145)

## 2023-02-06 LAB — CBC WITH DIFFERENTIAL/PLATELET
Abs Immature Granulocytes: 0.06 10*3/uL (ref 0.00–0.07)
Basophils Absolute: 0.1 10*3/uL (ref 0.0–0.1)
Basophils Relative: 1 %
Eosinophils Absolute: 0.4 10*3/uL (ref 0.0–0.5)
Eosinophils Relative: 4 %
HCT: 48.2 % (ref 39.0–52.0)
Hemoglobin: 15.5 g/dL (ref 13.0–17.0)
Immature Granulocytes: 1 %
Lymphocytes Relative: 26 %
Lymphs Abs: 3 10*3/uL (ref 0.7–4.0)
MCH: 28.5 pg (ref 26.0–34.0)
MCHC: 32.2 g/dL (ref 30.0–36.0)
MCV: 88.8 fL (ref 80.0–100.0)
Monocytes Absolute: 0.9 10*3/uL (ref 0.1–1.0)
Monocytes Relative: 8 %
Neutro Abs: 7.1 10*3/uL (ref 1.7–7.7)
Neutrophils Relative %: 60 %
Platelets: 153 10*3/uL (ref 150–400)
RBC: 5.43 MIL/uL (ref 4.22–5.81)
RDW: 15 % (ref 11.5–15.5)
WBC: 11.6 10*3/uL — ABNORMAL HIGH (ref 4.0–10.5)
nRBC: 0 % (ref 0.0–0.2)

## 2023-02-06 LAB — ETHANOL: Alcohol, Ethyl (B): 183 mg/dL — ABNORMAL HIGH (ref <10)

## 2023-02-06 MED ORDER — EPINEPHRINE 0.3 MG/0.3ML IJ SOAJ: 0.3000 mg | INTRAMUSCULAR | 0 refills | Status: DC | PRN

## 2023-02-06 MED ORDER — PREDNISONE 10 MG PO TABS: 20.0000 mg | ORAL_TABLET | Freq: Every day | ORAL | 0 refills | Status: AC

## 2023-02-06 MED ORDER — FAMOTIDINE 20 MG PO TABS: 20.0000 mg | ORAL_TABLET | Freq: Two times a day (BID) | ORAL | 0 refills | Status: DC

## 2023-02-06 MED ORDER — DIPHENHYDRAMINE HCL 50 MG/ML IJ SOLN
25.0000 mg | Freq: Once | INTRAMUSCULAR | Status: AC
  Administered 2023-02-06: 25 mg via INTRAVENOUS
  Filled 2023-02-06: qty 1

## 2023-02-06 MED ORDER — LORATADINE 10 MG PO TABS: 10.0000 mg | ORAL_TABLET | Freq: Every day | ORAL | 0 refills | Status: DC

## 2023-02-06 MED ORDER — FAMOTIDINE IN NACL 20-0.9 MG/50ML-% IV SOLN
20.0000 mg | Freq: Once | INTRAVENOUS | Status: AC
  Administered 2023-02-06: 20 mg via INTRAVENOUS
  Filled 2023-02-06: qty 50

## 2023-02-06 NOTE — ED Provider Notes (Signed)
[place] EMERGENCY DEPARTMENT AT [facility] Provider Note   CSN: [id number] Arrival date & time: 02/06/23  4098     History  Chief Complaint  Patient presents with   Allergic Reaction    Timothy Montgomery is a 52 y.o. male.  HPI   Patient with medical history including alcohol dependency, presenting with complaints of allergic reaction.  Patient states that he was outside in was laying on the ground and got bit by multiple aunts, he states that he started feel his throat was closing up so he jumped into a pool, he states that he noted bumps on his back which were not itchy, he denies any nausea or vomiting, states he never had allergic reaction to ant bites.  He denies any new medications, no new foods, no new detergents, endorsing any chest pain or shortness of breath, denies any tongue throat lip swelling difficulty breathing currently.  Per EMS patient was given epinephrine around midnight, as well as a DuoNeb.  Home Medications Prior to Admission medications   Medication Sig Start Date End Date Taking? Authorizing Provider  EPINEPHrine (EPIPEN 2-PAK) 0.3 mg/0.3 mL IJ SOAJ injection Inject 0.3 mg into the muscle as needed for anaphylaxis. 02/06/23  Yes Carroll Sage, PA-C  famotidine (PEPCID) 20 MG tablet Take 1 tablet (20 mg total) by mouth 2 (two) times daily for 5 days. 02/06/23 02/11/23 Yes Carroll Sage, PA-C  loratadine (CLARITIN) 10 MG tablet Take 1 tablet (10 mg total) by mouth daily for 5 days. 02/06/23 02/11/23 Yes Carroll Sage, PA-C  predniSONE (DELTASONE) 10 MG tablet Take 2 tablets (20 mg total) by mouth daily for 5 days. 02/06/23 02/11/23 Yes Carroll Sage, PA-C      Allergies    Patient has no known allergies.    Review of Systems   Review of Systems  Constitutional:  Negative for chills and fever.  HENT:  Positive for trouble swallowing.   Respiratory:  Positive for shortness of breath.   Cardiovascular:  Negative for chest  pain.  Gastrointestinal:  Negative for abdominal pain.  Neurological:  Negative for headaches.    Physical Exam Updated Vital Signs BP (!) 151/84   Pulse 76   Temp 98 F (36.7 C) (Oral)   Resp 20   SpO2 100%  Physical Exam Vitals and nursing note reviewed.  Constitutional:      General: He is not in acute distress.    Appearance: He is not ill-appearing.  HENT:     Head: Normocephalic and atraumatic.     Comments: No notable facial edema    Nose: No congestion.     Mouth/Throat:     Mouth: Mucous membranes are moist.     Pharynx: Oropharynx is clear. No oropharyngeal exudate or posterior oropharyngeal erythema.     Comments: No oral edema present, no trismus no torticollis, tongue uvula both midline controlling oral secretions tonsils both equal symmetric bilaterally, no submandibular swelling, no muffled tone voice. Eyes:     Conjunctiva/sclera: Conjunctivae normal.  Cardiovascular:     Rate and Rhythm: Normal rate and regular rhythm.     Pulses: Normal pulses.     Heart sounds: No murmur heard.    No friction rub. No gallop.  Pulmonary:     Effort: No respiratory distress.     Breath sounds: No wheezing, rhonchi or rales.  Skin:    General: Skin is warm and dry.     Comments: Patient has noted bite  marks on his back with surrounding hives, no noted hives on his arms chest or lower extremities.  Neurological:     Mental Status: He is alert.  Psychiatric:        Mood and Affect: Mood normal.     ED Results / Procedures / Treatments   Labs (all labs ordered are listed, but only abnormal results are displayed) Labs Reviewed  BASIC METABOLIC PANEL - Abnormal; Notable for the following components:      Result Value   Glucose, Bld 108 (*)    All other components within normal limits  CBC WITH DIFFERENTIAL/PLATELET - Abnormal; Notable for the following components:   WBC 11.6 (*)    All other components within normal limits  ETHANOL - Abnormal; Notable for the  following components:   Alcohol, Ethyl (B) 183 (*)    All other components within normal limits    EKG EKG Interpretation Date/Time:  Friday February 06 2023 00:38:01 EDT Ventricular Rate:  77 PR Interval:  128 QRS Duration:  86 QT Interval:  398 QTC Calculation: 451 R Axis:   64  Text Interpretation: Sinus rhythm Probable left atrial enlargement Probable left ventricular hypertrophy No significant change was found Confirmed by Drema Pry ([id number]) on 02/06/2023 2:25:43 AM  Radiology No results found.  Procedures Procedures    Medications Ordered in ED Medications  famotidine (PEPCID) IVPB 20 mg premix (0 mg Intravenous Stopped 02/06/23 0213)  diphenhydrAMINE (BENADRYL) injection 25 mg (25 mg Intravenous Given 02/06/23 0118)    ED Course/ Medical Decision Making/ A&P                                 Medical Decision Making Amount and/or Complexity of Data Reviewed Labs: ordered.  Risk OTC drugs. Prescription drug management.   This patient presents to the ED for concern of allergic reaction, this involves an extensive number of treatment options, and is a complaint that carries with it a high risk of complications and morbidity.  The differential diagnosis includes angioedema, anaphylaxis, [place] angina    Additional history obtained:  Additional history obtained from EMS External records from outside source obtained and reviewed including ER notes   Co morbidities that complicate the patient evaluation  Alcohol dependency  Social Determinants of Health:  No PCP    Lab Tests:  I Ordered, and personally interpreted labs.  The pertinent results include: CBC shows leukocytosis 11.6, BMP reveals glucose 106, ethanol 183   Imaging Studies ordered:  I ordered imaging studies including N/A I independently visualized and interpreted imaging which showed N/A I agree with the radiologist interpretation   Cardiac Monitoring:  The patient was maintained on  a cardiac monitor.  I personally viewed and interpreted the cardiac monitored which showed an underlying rhythm of: Without signs of ischemia   Medicines ordered and prescription drug management:  I ordered medication including H1 and H2 blockers I have reviewed the patients home medicines and have made adjustments as needed  Critical Interventions:  N/a   Reevaluation:  After allergic reaction, patient has a benign physical exam, will provide H1 H2 blockers and continue to monitor.  Patient was reassessed resting comfortably, no noted hives, vital signs reassuring, no oral edema, no muffled tone voice, controlling oral secretions, patient is in agreement with discharge at this time  Consultations Obtained:  N/A    Test Considered:  N/A    Rule out Suspicion for angioedema  low at this time which is no oral involvement.  I doubt anaphylaxis presentation atypical, vital signs are reassuring.  Doubt Ludwig angina this is no submandibular swelling    Dispostion and problem list  After consideration of the diagnostic results and the patients response to treatment, I feel that the patent would benefit from discharge.  Allergic reaction-will provide short course of steroids, recommend continue taking H1 and H2 blockers, provide with an EpiPen, can follow-up with an allergist for further assessment.            Final Clinical Impression(s) / ED Diagnoses Final diagnoses:  None    Rx / DC Orders ED Discharge Orders          Ordered    EPINEPHrine (EPIPEN 2-PAK) 0.3 mg/0.3 mL IJ SOAJ injection  As needed        02/06/23 0354    loratadine (CLARITIN) 10 MG tablet  Daily        02/06/23 0354    famotidine (PEPCID) 20 MG tablet  2 times daily        02/06/23 0354    predniSONE (DELTASONE) 10 MG tablet  Daily        02/06/23 0354              Carroll Sage, PA-C 02/06/23 0411    Nira Conn, MD 02/06/23 [phone]

## 2023-02-06 NOTE — ED Triage Notes (Signed)
Pt presents via EMS c/o allergic reaction. Reports bit by several ants. EMS reports pt c/o SOB, lip swelling, "throat closing" per pt report. EMS reports pt wheezing on arrival. 0.3mg  IM Epi, 5mg  Albuterol, 0.5mg  Atrovent given by EMS.

## 2023-02-06 NOTE — ED Notes (Signed)
Water and sandwich tray given per pt request.

## 2023-02-06 NOTE — ED Notes (Signed)
Pt independently ambulated to restroom without difficulty.

## 2023-02-06 NOTE — Discharge Instructions (Addendum)
Possible you had an allergic reaction, I have given you prednisone, Claritin, and Pepcid, please take as prescribed.  I have also given you an EpiPen, please use if you are having allergic reaction where  developed tongue throat lip swelling difficulty breathing, systemic rash.  If you use an EpiPen please call 911 for further assessment  Please follow-up with community health and wellness and/or the allergy clinic for further assessment  For constipation you may take MiraLAX over-the-counter, please remember to drink water with his medications only works if you are hydrated, recommend 1 capful daily, can take up to 3 days until you start to see results, titrate down to frequency and consistency.  Come back to the emergency department if you develop chest pain, shortness of breath, severe abdominal pain, uncontrolled nausea, vomiting, diarrhea.

## 2023-05-19 ENCOUNTER — Emergency Department (HOSPITAL_COMMUNITY): Payer: 59

## 2023-05-19 ENCOUNTER — Other Ambulatory Visit: Payer: Self-pay

## 2023-05-19 ENCOUNTER — Encounter (HOSPITAL_COMMUNITY): Payer: Self-pay

## 2023-05-19 ENCOUNTER — Emergency Department (HOSPITAL_COMMUNITY)
Admission: EM | Admit: 2023-05-19 | Discharge: 2023-05-20 | Disposition: A | Discharge status: Home / Self Care | Payer: 59 | Attending: Emergency Medicine | Admitting: Emergency Medicine

## 2023-05-19 DIAGNOSIS — Z23 Encounter for immunization: Secondary | ICD-10-CM | POA: Diagnosis not present

## 2023-05-19 DIAGNOSIS — T148XXA Other injury of unspecified body region, initial encounter: Secondary | ICD-10-CM | POA: Insufficient documentation

## 2023-05-19 DIAGNOSIS — S0101XA Laceration without foreign body of scalp, initial encounter: Secondary | ICD-10-CM | POA: Insufficient documentation

## 2023-05-19 DIAGNOSIS — S59902A Unspecified injury of left elbow, initial encounter: Secondary | ICD-10-CM | POA: Insufficient documentation

## 2023-05-19 DIAGNOSIS — S0990XA Unspecified injury of head, initial encounter: Secondary | ICD-10-CM | POA: Diagnosis present

## 2023-05-19 DIAGNOSIS — T07XXXA Unspecified multiple injuries, initial encounter: Secondary | ICD-10-CM

## 2023-05-19 DIAGNOSIS — I10 Essential (primary) hypertension: Secondary | ICD-10-CM | POA: Diagnosis not present

## 2023-05-19 HISTORY — DX: Essential (primary) hypertension: I10
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
## 2023-05-20 ENCOUNTER — Emergency Department (HOSPITAL_COMMUNITY): Payer: 59

## 2023-05-20 DIAGNOSIS — S0101XA Laceration without foreign body of scalp, initial encounter: Secondary | ICD-10-CM | POA: Diagnosis not present

## 2023-05-20 MED ORDER — ACETAMINOPHEN 500 MG PO TABS
1000.0000 mg | ORAL_TABLET | Freq: Once | ORAL | Status: AC
  Administered 2023-05-20: 1000 mg via ORAL
  Filled 2023-05-20: qty 2

## 2023-05-20 MED ORDER — TETANUS-DIPHTH-ACELL PERTUSSIS 5-2.5-18.5 LF-MCG/0.5 IM SUSY
0.5000 mL | PREFILLED_SYRINGE | Freq: Once | INTRAMUSCULAR | Status: AC
  Administered 2023-05-20: 0.5 mL via INTRAMUSCULAR
  Filled 2023-05-20: qty 0.5

## 2023-05-20 MED ORDER — LIDOCAINE-EPINEPHRINE (PF) 2 %-1:200000 IJ SOLN
20.0000 mL | Freq: Once | INTRAMUSCULAR | Status: AC
  Administered 2023-05-20: 20 mL via INTRADERMAL
  Filled 2023-05-20: qty 20

## 2023-05-20 MED ORDER — IBUPROFEN 200 MG PO TABS
600.0000 mg | ORAL_TABLET | Freq: Once | ORAL | Status: AC
  Administered 2023-05-20: 600 mg via ORAL
  Filled 2023-05-20: qty 3
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
## 2023-05-20 NOTE — ED Notes (Signed)
Wound irrigated.

## 2023-05-20 NOTE — Discharge Instructions (Signed)
For pain control you may take 1000 mg of acetaminophen (Tylenol) every 8 hours and/or 600 mg of Ibuprofen (Motrin, Advil, etc.) every 6-8 hours as needed.  Please limit acetaminophen (Tylenol) to 4000 mg and Ibuprofen (Motrin, Advil, etc.) to 2400 mg for a 24hr period. Please note that other over-the-counter medicine may contain acetaminophen or ibuprofen as a component of their ingredients.   Do not let your laceration (cut) get wet for the next 48 hours. After that you may allow soapy water to drain down the wound to clean it.  Please do not scrub.  Do not submerge the wound under water for the next 2 weeks.   To minimize scarring, you can apply a vaseline based ointment for the next 2 weeks and keep it out of direct sun light. After that, you may apply sunscreen for the next several months.   Your staple will need to be removed in 5-7 days.   Return if your wound appears to be infected (see laceration care instructions).

## 2023-05-22 ENCOUNTER — Encounter (HOSPITAL_COMMUNITY): Payer: Self-pay

## 2023-05-22 ENCOUNTER — Emergency Department (HOSPITAL_COMMUNITY): Payer: 59

## 2023-05-22 ENCOUNTER — Emergency Department (HOSPITAL_COMMUNITY)
Admission: EM | Admit: 2023-05-22 | Discharge: 2023-05-22 | Disposition: A | Discharge status: Home / Self Care | Payer: 59 | Attending: Emergency Medicine | Admitting: Emergency Medicine

## 2023-05-22 ENCOUNTER — Other Ambulatory Visit: Payer: Self-pay

## 2023-05-22 DIAGNOSIS — I10 Essential (primary) hypertension: Secondary | ICD-10-CM | POA: Diagnosis not present

## 2023-05-22 DIAGNOSIS — S52042A Displaced fracture of coronoid process of left ulna, initial encounter for closed fracture: Secondary | ICD-10-CM | POA: Diagnosis not present

## 2023-05-22 DIAGNOSIS — S53105A Unspecified dislocation of left ulnohumeral joint, initial encounter: Secondary | ICD-10-CM | POA: Diagnosis present

## 2023-05-22 MED ORDER — KETAMINE HCL 50 MG/5ML IJ SOSY
0.5000 mg/kg | PREFILLED_SYRINGE | Freq: Once | INTRAMUSCULAR | Status: AC
  Administered 2023-05-22: 30 mg via INTRAVENOUS
  Filled 2023-05-22: qty 5

## 2023-05-22 MED ORDER — IBUPROFEN 800 MG PO TABS
800.0000 mg | ORAL_TABLET | Freq: Once | ORAL | Status: AC
  Administered 2023-05-22: 800 mg via ORAL
  Filled 2023-05-22: qty 1

## 2023-05-22 MED ORDER — PROPOFOL 10 MG/ML IV BOLUS
0.5000 mg/kg | Freq: Once | INTRAVENOUS | Status: AC
  Administered 2023-05-22: 29.5 mg via INTRAVENOUS
  Filled 2023-05-22: qty 20

## 2023-05-22 NOTE — Progress Notes (Signed)
Orthopedic Tech Progress Note Patient Details:  Timothy Montgomery Jul 06, 1970 [id number]  Ortho Devices Type of Ortho Device: Long arm splint, Arm sling Ortho Device/Splint Location: LUE Ortho Device/Splint Interventions: Application   Post Interventions Patient Tolerated: Well  Timothy Montgomery 05/22/2023, 7:01 PM

## 2023-05-22 NOTE — Sedation Documentation (Signed)
Dr. Suezanne Jacquet, [place], RT, Ladona Ridgel, [place], [place], RN at bedside for procedure

## 2023-05-22 NOTE — ED Provider Triage Note (Signed)
Emergency Medicine Provider Triage Evaluation Note  Timothy Montgomery , a 52 y.o. male  was evaluated in triage.  Pt complains of left elbow/arm pain and swelling.    Review of Systems  Positive: As above Negative: As above  Physical Exam  BP 139/85 (BP Location: Right Arm)   Pulse 89   Temp 98 F (36.7 C) (Oral)   Resp 16   Wt 59 kg   SpO2 98%   BMI 20.99 kg/m  Gen:   Awake, no distress   Resp:  Normal effort  MSK:   Moves extremities without difficulty with exception of left arm - significant swelling to left arm around the elbow; unable to flex his arm or lift it without assistance; radial pulse is 2+. Exquisitely tender to palpation.  Other:    Medical Decision Making  Medically screening exam initiated at 1:05 PM.  Appropriate orders placed.  Timothy Montgomery was informed that the remainder of the evaluation will be completed by another provider, this initial triage assessment does not replace that evaluation, and the importance of remaining in the ED until their evaluation is complete.     Timothy Alar R, PA-C 05/22/23 1309

## 2023-05-22 NOTE — ED Notes (Signed)
Consent form signed by patient.  
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
## 2023-05-22 NOTE — Discharge Instructions (Signed)
We evaluated you for your elbow pain.  Your x-rays show that you have a broken bone in your elbow.  This was a small fracture that was not picked up on your initial ER visit.  We were able to reduce your elbow back into a normal position.  We have placed you in a splint.  Please keep this on at all times as your elbow fracture makes your elbow extremely likely to dislocate again.  It is extremely important also to follow-up with the orthopedic surgeon.  You may need a surgery for this.  Please take Tylenol and Motrin for your symptoms at home.  You can take 1000 mg of Tylenol every 6 hours and 600 mg of ibuprofen every 6 hours as needed for your symptoms.  You can take these medicines together as needed, either at the same time, or alternating every 3 hours.  Please return to the emergency department for any new or worsening symptoms such as recurrent dislocation, severe pain in your arm, numbness or tingling, fevers or chills, or any other concerning symptoms.

## 2023-05-22 NOTE — Progress Notes (Signed)
Conscious sedation:  PT on 2 LPM nasal cannula, Sp02 maintained throughout >=96%.  End tidal ranged 9-16 throughout procedure.  PT awake and appropriate before RT left room with MD and RN consent.

## 2023-05-25 ENCOUNTER — Other Ambulatory Visit: Payer: Self-pay

## 2023-05-25 ENCOUNTER — Emergency Department (HOSPITAL_COMMUNITY)
Admission: EM | Admit: 2023-05-25 | Discharge: 2023-05-25 | Disposition: A | Discharge status: Home / Self Care | Payer: 59 | Attending: Student | Admitting: Student

## 2023-05-25 ENCOUNTER — Encounter (HOSPITAL_COMMUNITY): Payer: Self-pay

## 2023-05-25 DIAGNOSIS — Z4802 Encounter for removal of sutures: Secondary | ICD-10-CM | POA: Insufficient documentation

## 2023-05-25 NOTE — Discharge Instructions (Addendum)
Stable today, the site looks good.  Return to the ER if you feel like you have worsening symptoms, redness, swelling of the area.  Keep the area clean and dry.
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
## 2023-05-25 NOTE — ED Triage Notes (Signed)
1 staple in head 5 days ago. Pt is here for removal.

## 2023-06-19 ENCOUNTER — Encounter: Payer: Self-pay | Admitting: Neurology

## 2023-06-19 ENCOUNTER — Ambulatory Visit (INDEPENDENT_AMBULATORY_CARE_PROVIDER_SITE_OTHER): Payer: Medicaid Other | Admitting: Neurology

## 2023-06-19 VITALS — BP 148/98 | HR 65 | Ht 66.0 in | Wt 142.0 lb

## 2023-06-19 DIAGNOSIS — G40909 Epilepsy, unspecified, not intractable, without status epilepticus: Secondary | ICD-10-CM | POA: Diagnosis not present

## 2023-06-19 MED ORDER — LACOSAMIDE 100 MG PO TABS: 100.0000 mg | ORAL_TABLET | Freq: Two times a day (BID) | ORAL | 5 refills | Status: DC

## 2023-06-19 NOTE — Patient Instructions (Addendum)
 Start Vimpat  100 mg twice daily  Routine EEG, I will contact you to go over the result Please contact me if you have any breakthrough seizure or any side effect from the medication Driving restriction for the next 6 months  Follow-up in 6 months or sooner if worse.
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
## 2023-07-04 ENCOUNTER — Other Ambulatory Visit: Payer: Self-pay

## 2023-07-04 ENCOUNTER — Emergency Department (HOSPITAL_COMMUNITY): Payer: Medicaid Other

## 2023-07-04 ENCOUNTER — Emergency Department (HOSPITAL_COMMUNITY)
Admission: EM | Admit: 2023-07-04 | Discharge: 2023-07-05 | Disposition: A | Discharge status: Home / Self Care | Payer: Medicaid Other | Attending: Emergency Medicine | Admitting: Emergency Medicine

## 2023-07-04 ENCOUNTER — Encounter (HOSPITAL_COMMUNITY): Payer: Self-pay

## 2023-07-04 DIAGNOSIS — K2289 Other specified disease of esophagus: Secondary | ICD-10-CM

## 2023-07-04 DIAGNOSIS — K222 Esophageal obstruction: Secondary | ICD-10-CM | POA: Diagnosis not present

## 2023-07-04 DIAGNOSIS — S2231XA Fracture of one rib, right side, initial encounter for closed fracture: Secondary | ICD-10-CM | POA: Diagnosis not present

## 2023-07-04 DIAGNOSIS — Y92410 Unspecified street and highway as the place of occurrence of the external cause: Secondary | ICD-10-CM | POA: Diagnosis not present

## 2023-07-04 DIAGNOSIS — M546 Pain in thoracic spine: Secondary | ICD-10-CM | POA: Diagnosis present

## 2023-07-04 LAB — I-STAT CHEM 8, ED
BUN: 16 mg/dL (ref 6–20)
Calcium, Ion: 1.2 mmol/L (ref 1.15–1.40)
Chloride: 107 mmol/L (ref 98–111)
Creatinine, Ser: 1.2 mg/dL (ref 0.61–1.24)
Glucose, Bld: 111 mg/dL — ABNORMAL HIGH (ref 70–99)
HCT: 47 % (ref 39.0–52.0)
Hemoglobin: 16 g/dL (ref 13.0–17.0)
Potassium: 3.9 mmol/L (ref 3.5–5.1)
Sodium: 140 mmol/L (ref 135–145)
TCO2: 23 mmol/L (ref 22–32)

## 2023-07-04 MED ORDER — MORPHINE SULFATE (PF) 4 MG/ML IV SOLN
4.0000 mg | Freq: Once | INTRAVENOUS | Status: AC
  Administered 2023-07-04: 4 mg via INTRAVENOUS
  Filled 2023-07-04: qty 1
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
## 2023-07-05 ENCOUNTER — Emergency Department (HOSPITAL_COMMUNITY): Payer: Medicaid Other

## 2023-07-05 DIAGNOSIS — S2231XA Fracture of one rib, right side, initial encounter for closed fracture: Secondary | ICD-10-CM | POA: Diagnosis not present

## 2023-07-05 MED ORDER — IOHEXOL 300 MG/ML  SOLN
100.0000 mL | Freq: Once | INTRAMUSCULAR | Status: AC | PRN
  Administered 2023-07-05: 100 mL via INTRAVENOUS

## 2023-07-05 MED ORDER — HYDROCODONE-ACETAMINOPHEN 5-325 MG PO TABS: 1.0000 | ORAL_TABLET | ORAL | 0 refills | Status: DC | PRN

## 2023-07-05 MED ORDER — LIDOCAINE 5 % EX PTCH: 1.0000 | MEDICATED_PATCH | CUTANEOUS | 0 refills | Status: DC

## 2023-07-05 MED ORDER — LIDOCAINE 5 % EX PTCH
1.0000 | MEDICATED_PATCH | CUTANEOUS | Status: DC
  Administered 2023-07-05: 1 via TRANSDERMAL
  Filled 2023-07-05: qty 1

## 2023-07-05 MED ORDER — KETOROLAC TROMETHAMINE 15 MG/ML IJ SOLN
15.0000 mg | Freq: Once | INTRAMUSCULAR | Status: AC
  Administered 2023-07-05: 15 mg via INTRAVENOUS
  Filled 2023-07-05: qty 1
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
## 2023-07-06 ENCOUNTER — Encounter: Payer: Self-pay | Admitting: Gastroenterology

## 2023-07-15 ENCOUNTER — Other Ambulatory Visit: Payer: Medicaid Other | Admitting: *Deleted

## 2023-07-15 ENCOUNTER — Emergency Department (HOSPITAL_COMMUNITY): Payer: Medicaid Other

## 2023-07-15 ENCOUNTER — Emergency Department (HOSPITAL_COMMUNITY)
Admission: EM | Admit: 2023-07-15 | Discharge: 2023-07-15 | Disposition: A | Discharge status: Home / Self Care | Payer: 59 | Attending: Emergency Medicine | Admitting: Emergency Medicine

## 2023-07-15 ENCOUNTER — Encounter (HOSPITAL_COMMUNITY): Payer: Self-pay

## 2023-07-15 ENCOUNTER — Other Ambulatory Visit: Payer: Self-pay

## 2023-07-15 DIAGNOSIS — S2231XD Fracture of one rib, right side, subsequent encounter for fracture with routine healing: Secondary | ICD-10-CM | POA: Insufficient documentation

## 2023-07-15 DIAGNOSIS — S299XXD Unspecified injury of thorax, subsequent encounter: Secondary | ICD-10-CM | POA: Diagnosis present

## 2023-07-15 DIAGNOSIS — Z79899 Other long term (current) drug therapy: Secondary | ICD-10-CM | POA: Diagnosis not present

## 2023-07-15 MED ORDER — LIDOCAINE 5 % EX PTCH
1.0000 | MEDICATED_PATCH | Freq: Once | CUTANEOUS | Status: DC
  Administered 2023-07-15: 1 via TRANSDERMAL
  Filled 2023-07-15: qty 1

## 2023-07-15 NOTE — Discharge Instructions (Signed)
 If you develop trouble breathing, new or worsening pain, or any other new/concerning symptoms then return to the ER or call 911
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
## 2023-07-28 ENCOUNTER — Emergency Department (HOSPITAL_COMMUNITY): Payer: 59

## 2023-07-28 ENCOUNTER — Other Ambulatory Visit: Payer: Self-pay

## 2023-07-28 ENCOUNTER — Encounter (HOSPITAL_COMMUNITY): Payer: Self-pay

## 2023-07-28 ENCOUNTER — Emergency Department (HOSPITAL_COMMUNITY)
Admission: EM | Admit: 2023-07-28 | Discharge: 2023-07-29 | Disposition: A | Discharge status: Home / Self Care | Payer: 59 | Attending: Emergency Medicine | Admitting: Emergency Medicine

## 2023-07-28 ENCOUNTER — Other Ambulatory Visit: Payer: Medicaid Other | Admitting: *Deleted

## 2023-07-28 DIAGNOSIS — Y92410 Unspecified street and highway as the place of occurrence of the external cause: Secondary | ICD-10-CM | POA: Insufficient documentation

## 2023-07-28 DIAGNOSIS — F10929 Alcohol use, unspecified with intoxication, unspecified: Secondary | ICD-10-CM | POA: Diagnosis not present

## 2023-07-28 DIAGNOSIS — Z79899 Other long term (current) drug therapy: Secondary | ICD-10-CM | POA: Diagnosis not present

## 2023-07-28 DIAGNOSIS — S02842A Fracture of lateral orbital wall, left side, initial encounter for closed fracture: Secondary | ICD-10-CM | POA: Diagnosis not present

## 2023-07-28 DIAGNOSIS — Y908 Blood alcohol level of 240 mg/100 ml or more: Secondary | ICD-10-CM | POA: Diagnosis not present

## 2023-07-28 DIAGNOSIS — S63094A Other dislocation of right wrist and hand, initial encounter: Secondary | ICD-10-CM | POA: Insufficient documentation

## 2023-07-28 DIAGNOSIS — S0240FA Zygomatic fracture, left side, initial encounter for closed fracture: Secondary | ICD-10-CM | POA: Diagnosis not present

## 2023-07-28 DIAGNOSIS — S72009S Fracture of unspecified part of neck of unspecified femur, sequela: Secondary | ICD-10-CM

## 2023-07-28 DIAGNOSIS — S2241XA Multiple fractures of ribs, right side, initial encounter for closed fracture: Secondary | ICD-10-CM | POA: Diagnosis not present

## 2023-07-28 DIAGNOSIS — M25331 Other instability, right wrist: Secondary | ICD-10-CM

## 2023-07-28 DIAGNOSIS — S2249XA Multiple fractures of ribs, unspecified side, initial encounter for closed fracture: Secondary | ICD-10-CM

## 2023-07-28 DIAGNOSIS — Y9355 Activity, bike riding: Secondary | ICD-10-CM | POA: Diagnosis not present

## 2023-07-28 DIAGNOSIS — S0240DA Maxillary fracture, left side, initial encounter for closed fracture: Secondary | ICD-10-CM | POA: Diagnosis not present

## 2023-07-28 DIAGNOSIS — S01112A Laceration without foreign body of left eyelid and periocular area, initial encounter: Secondary | ICD-10-CM | POA: Diagnosis not present

## 2023-07-28 DIAGNOSIS — I10 Essential (primary) hypertension: Secondary | ICD-10-CM | POA: Insufficient documentation

## 2023-07-28 DIAGNOSIS — M25531 Pain in right wrist: Secondary | ICD-10-CM | POA: Diagnosis present

## 2023-07-28 LAB — URINALYSIS, ROUTINE W REFLEX MICROSCOPIC
Bacteria, UA: NONE SEEN
Bilirubin Urine: NEGATIVE
Glucose, UA: NEGATIVE mg/dL
Ketones, ur: NEGATIVE mg/dL
Leukocytes,Ua: NEGATIVE
Nitrite: NEGATIVE
Protein, ur: NEGATIVE mg/dL
Specific Gravity, Urine: 1.026 (ref 1.005–1.030)
pH: 7 (ref 5.0–8.0)

## 2023-07-28 LAB — COMPREHENSIVE METABOLIC PANEL
ALT: 28 U/L (ref 0–44)
AST: 25 U/L (ref 15–41)
Albumin: 4.1 g/dL (ref 3.5–5.0)
Alkaline Phosphatase: 84 U/L (ref 38–126)
Anion gap: 12 (ref 5–15)
BUN: 11 mg/dL (ref 6–20)
CO2: 20 mmol/L — ABNORMAL LOW (ref 22–32)
Calcium: 9.4 mg/dL (ref 8.9–10.3)
Chloride: 108 mmol/L (ref 98–111)
Creatinine, Ser: 1.13 mg/dL (ref 0.61–1.24)
GFR, Estimated: >60 mL/min (ref >60)
Glucose, Bld: 114 mg/dL — ABNORMAL HIGH (ref 70–99)
Potassium: 3.7 mmol/L (ref 3.5–5.1)
Sodium: 140 mmol/L (ref 135–145)
Total Bilirubin: 0.5 mg/dL (ref 0.0–1.2)
Total Protein: 7.3 g/dL (ref 6.5–8.1)

## 2023-07-28 LAB — CBC
HCT: 47.1 % (ref 39.0–52.0)
Hemoglobin: 15.3 g/dL (ref 13.0–17.0)
MCH: 29.7 pg (ref 26.0–34.0)
MCHC: 32.5 g/dL (ref 30.0–36.0)
MCV: 91.5 fL (ref 80.0–100.0)
Platelets: 141 10*3/uL — ABNORMAL LOW (ref 150–400)
RBC: 5.15 MIL/uL (ref 4.22–5.81)
RDW: 13.7 % (ref 11.5–15.5)
WBC: 10.7 10*3/uL — ABNORMAL HIGH (ref 4.0–10.5)
nRBC: 0 % (ref 0.0–0.2)

## 2023-07-28 LAB — PROTIME-INR
INR: 0.9 (ref 0.8–1.2)
Prothrombin Time: 12.6 s (ref 11.4–15.2)

## 2023-07-28 LAB — I-STAT CHEM 8, ED
BUN: 11 mg/dL (ref 6–20)
Calcium, Ion: 1.07 mmol/L — ABNORMAL LOW (ref 1.15–1.40)
Chloride: 109 mmol/L (ref 98–111)
Creatinine, Ser: 1.3 mg/dL — ABNORMAL HIGH (ref 0.61–1.24)
Glucose, Bld: 113 mg/dL — ABNORMAL HIGH (ref 70–99)
HCT: 47 % (ref 39.0–52.0)
Hemoglobin: 16 g/dL (ref 13.0–17.0)
Potassium: 3.6 mmol/L (ref 3.5–5.1)
Sodium: 141 mmol/L (ref 135–145)
TCO2: 21 mmol/L — ABNORMAL LOW (ref 22–32)

## 2023-07-28 LAB — SAMPLE TO BLOOD BANK

## 2023-07-28 LAB — I-STAT CG4 LACTIC ACID, ED: Lactic Acid, Venous: 1.7 mmol/L (ref 0.5–1.9)

## 2023-07-28 LAB — ETHANOL: Alcohol, Ethyl (B): 274 mg/dL — ABNORMAL HIGH (ref <10)

## 2023-07-28 MED ORDER — IOHEXOL 350 MG/ML SOLN
75.0000 mL | Freq: Once | INTRAVENOUS | Status: AC | PRN
  Administered 2023-07-28: 75 mL via INTRAVENOUS

## 2023-07-28 MED ORDER — HYDROMORPHONE HCL 1 MG/ML IJ SOLN
1.0000 mg | Freq: Once | INTRAMUSCULAR | Status: AC
  Administered 2023-07-28: 1 mg via INTRAVENOUS
  Filled 2023-07-28: qty 1
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
## 2023-07-28 NOTE — ED Notes (Signed)
 X-ray at bedside

## 2023-07-28 NOTE — ED Notes (Signed)
Pt stood and urinated in the floor without staff assistance. Pt assisted back in bed, gown placed back on pt and educated pt on not getting up without help and use of the urinal that is present at bedside.
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
## 2023-07-28 NOTE — ED Notes (Signed)
Pt found out of bed urinating in the floor again. Educated pt once again about using call light, not standing without assistance, and the use of the urinal present at bedside.
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
## 2023-07-29 DIAGNOSIS — S0240DA Maxillary fracture, left side, initial encounter for closed fracture: Secondary | ICD-10-CM | POA: Diagnosis not present

## 2023-07-29 MED ORDER — KETOROLAC TROMETHAMINE 15 MG/ML IJ SOLN
15.0000 mg | Freq: Once | INTRAMUSCULAR | Status: AC
  Administered 2023-07-29: 15 mg via INTRAVENOUS
  Filled 2023-07-29: qty 1

## 2023-07-29 MED ORDER — HYDROCODONE-ACETAMINOPHEN 5-325 MG PO TABS
1.0000 | ORAL_TABLET | Freq: Once | ORAL | Status: AC
  Administered 2023-07-29: 1 via ORAL
  Filled 2023-07-29: qty 1

## 2023-07-29 MED ORDER — HYDROCODONE-ACETAMINOPHEN 5-325 MG PO TABS: 2.0000 | ORAL_TABLET | ORAL | 0 refills | Status: DC | PRN

## 2023-07-29 MED ORDER — NAPROXEN 375 MG PO TABS: 375.0000 mg | ORAL_TABLET | Freq: Two times a day (BID) | ORAL | 0 refills | Status: DC

## 2023-07-29 NOTE — ED Notes (Signed)
Pt verbalized understanding of discharge instructions. Pt ambulatory at time of discharge. Pt was able to eat and drink at time of discharge. Pt given bus pass . Pt wheeled from ed.
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
## 2023-07-29 NOTE — ED Notes (Signed)
Assisted pt to use urinal

## 2023-07-30 ENCOUNTER — Other Ambulatory Visit (HOSPITAL_COMMUNITY): Payer: Self-pay | Admitting: Sports Medicine

## 2023-07-30 DIAGNOSIS — S72009S Fracture of unspecified part of neck of unspecified femur, sequela: Secondary | ICD-10-CM
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
## 2023-07-31 ENCOUNTER — Ambulatory Visit (INDEPENDENT_AMBULATORY_CARE_PROVIDER_SITE_OTHER): Payer: 59

## 2023-08-03 ENCOUNTER — Ambulatory Visit: Payer: 59 | Admitting: Gastroenterology

## 2023-08-10 ENCOUNTER — Other Ambulatory Visit: Payer: Medicaid Other | Admitting: *Deleted

## 2023-08-10 ENCOUNTER — Ambulatory Visit (INDEPENDENT_AMBULATORY_CARE_PROVIDER_SITE_OTHER): Payer: 59 | Admitting: Physician Assistant

## 2023-08-10 ENCOUNTER — Encounter: Payer: Self-pay | Admitting: *Deleted

## 2023-08-10 ENCOUNTER — Telehealth: Payer: Self-pay | Admitting: Neurology

## 2023-08-10 ENCOUNTER — Other Ambulatory Visit (INDEPENDENT_AMBULATORY_CARE_PROVIDER_SITE_OTHER)

## 2023-08-10 ENCOUNTER — Encounter: Payer: Self-pay | Admitting: Physician Assistant

## 2023-08-10 VITALS — BP 114/74 | HR 77 | Ht 67.0 in | Wt 138.2 lb

## 2023-08-10 DIAGNOSIS — Z789 Other specified health status: Secondary | ICD-10-CM

## 2023-08-10 DIAGNOSIS — R7989 Other specified abnormal findings of blood chemistry: Secondary | ICD-10-CM

## 2023-08-10 DIAGNOSIS — K2289 Other specified disease of esophagus: Secondary | ICD-10-CM | POA: Diagnosis not present

## 2023-08-10 DIAGNOSIS — K2100 Gastro-esophageal reflux disease with esophagitis, without bleeding: Secondary | ICD-10-CM

## 2023-08-10 DIAGNOSIS — D696 Thrombocytopenia, unspecified: Secondary | ICD-10-CM

## 2023-08-10 DIAGNOSIS — Z1211 Encounter for screening for malignant neoplasm of colon: Secondary | ICD-10-CM

## 2023-08-10 DIAGNOSIS — F1090 Alcohol use, unspecified, uncomplicated: Secondary | ICD-10-CM | POA: Diagnosis not present

## 2023-08-10 DIAGNOSIS — Z5982 Transportation insecurity: Secondary | ICD-10-CM

## 2023-08-10 LAB — IBC + FERRITIN
Ferritin: 111.7 ng/mL (ref 22.0–322.0)
Iron: 86 ug/dL (ref 42–165)
Saturation Ratios: 24.5 % (ref 20.0–50.0)
TIBC: 351.4 ug/dL (ref 250.0–450.0)
Transferrin: 251 mg/dL (ref 212.0–360.0)

## 2023-08-10 LAB — CBC WITH DIFFERENTIAL/PLATELET
Basophils Absolute: 0 10*3/uL (ref 0.0–0.1)
Basophils Relative: 0.4 % (ref 0.0–3.0)
Eosinophils Absolute: 0.4 10*3/uL (ref 0.0–0.7)
Eosinophils Relative: 6.2 % — ABNORMAL HIGH (ref 0.0–5.0)
HCT: 43.9 % (ref 39.0–52.0)
Hemoglobin: 14.8 g/dL (ref 13.0–17.0)
Lymphocytes Relative: 22.9 % (ref 12.0–46.0)
Lymphs Abs: 1.6 10*3/uL (ref 0.7–4.0)
MCHC: 33.8 g/dL (ref 30.0–36.0)
MCV: 90.5 fl (ref 78.0–100.0)
Monocytes Absolute: 0.6 10*3/uL (ref 0.1–1.0)
Monocytes Relative: 8.4 % (ref 3.0–12.0)
Neutro Abs: 4.3 10*3/uL (ref 1.4–7.7)
Neutrophils Relative %: 62.1 % (ref 43.0–77.0)
Platelets: 122 10*3/uL — ABNORMAL LOW (ref 150.0–400.0)
RBC: 4.85 Mil/uL (ref 4.22–5.81)
RDW: 14.5 % (ref 11.5–15.5)
WBC: 6.9 10*3/uL (ref 4.0–10.5)

## 2023-08-10 LAB — BASIC METABOLIC PANEL
BUN: 23 mg/dL (ref 6–23)
CO2: 27 meq/L (ref 19–32)
Calcium: 9.2 mg/dL (ref 8.4–10.5)
Chloride: 105 meq/L (ref 96–112)
Creatinine, Ser: 1.08 mg/dL (ref 0.40–1.50)
GFR: 78.81 mL/min (ref >60.00)
Glucose, Bld: 108 mg/dL — ABNORMAL HIGH (ref 70–99)
Potassium: 3.5 meq/L (ref 3.5–5.1)
Sodium: 140 meq/L (ref 135–145)

## 2023-08-10 LAB — HEPATIC FUNCTION PANEL
ALT: 30 U/L (ref 0–53)
AST: 35 U/L (ref 0–37)
Albumin: 3.9 g/dL (ref 3.5–5.2)
Alkaline Phosphatase: 86 U/L (ref 39–117)
Bilirubin, Direct: 0.2 mg/dL (ref 0.0–0.3)
Total Bilirubin: 0.7 mg/dL (ref 0.2–1.2)
Total Protein: 6.8 g/dL (ref 6.0–8.3)

## 2023-08-10 LAB — PROTIME-INR
INR: 1 ratio (ref 0.8–1.0)
Prothrombin Time: 10.8 s (ref 9.6–13.1)

## 2023-08-10 MED ORDER — PANTOPRAZOLE SODIUM 40 MG PO TBEC
40.0000 mg | DELAYED_RELEASE_TABLET | Freq: Two times a day (BID) | ORAL | 2 refills | Status: DC
Start: 2023-08-10 — End: 2023-08-14

## 2023-08-10 NOTE — Patient Instructions (Addendum)
 Your provider has ordered Cologuard testing as an option for colon cancer screening. This is performed by [employer] and may be out of network with your insurance. PRIOR to completing the test, it is YOUR responsibility to contact your insurance about covered benefits for this test. Your out of pocket expense could be anywhere from $0.00 to $649.00.   When you call to check coverage with your insurer, please provide the following information:   -The ONLY provider of Cologuard is Optician, dispensing  - CPT code for Cologuard is [phone].  [facility] [employer] NPI # [id number]  -[employer] Tax ID # [id number]   We have already sent your demographic and insurance information to [employer] (phone number [phone]) and they should contact you within the next week regarding your test. If you have not heard from them within the next week, please call our office at [phone].    You have been scheduled for an endoscopy. Please follow written instructions given to you at your visit today.  If you use inhalers (even only as needed), please bring them with you on the day of your procedure.  If you take any of the following medications, they will need to be adjusted prior to your procedure:   DO NOT TAKE 7 DAYS PRIOR TO TEST- Trulicity (dulaglutide) Ozempic, Wegovy (semaglutide) Mounjaro (tirzepatide) Bydureon Bcise (exanatide extended release)  DO NOT TAKE 1 DAY PRIOR TO YOUR TEST Rybelsus (semaglutide) Adlyxin (lixisenatide) Victoza (liraglutide) Byetta (exanatide) ___________________________________________________________________________  You have been scheduled for an abdominal ultrasound at [facility] (1st floor of hospital) on         . Please arrive 30 minutes prior to your appointment for registration. Make certain not to have anything to eat or drink 6 hours prior to your appointment. Should you need to reschedule your appointment,  please contact radiology at [phone]. This test typically takes about 30 minutes to perform.  Your provider has requested that you go to the basement level for lab work before leaving today. Press "B" on the elevator. The lab is located at the first door on the left as you exit the elevator.   Please take your proton pump inhibitor medication, pantoprazole  Please take this medication 30 minutes to 1 hour before meals- this makes it more effective.  Avoid spicy and acidic foods Avoid fatty foods Limit your intake of coffee, tea, alcohol, and carbonated drinks Work to maintain a healthy weight Keep the head of the bed elevated at least 3 inches with blocks or a wedge pillow if you are having any nighttime symptoms Stay upright for 2 hours after eating Avoid meals and snacks three to four hours before bedtime   Behavioral Health Resources in the [employer]   Intensive Outpatient Programs:  [facility]  [address]  [place], [place]  [phone]  Both a day and evening program  [facility]  [address]  [place], [place] [address]  [phone]  [facility]  [address]  [place]  [phone]    Residential Treatment Programs:  [facility]  [address]  [place]  [phone]  [facility]  [address], [place] [address]  [place], [place] [address]  [phone]  [facility]  [address] [place], [place] [address]  [phone]  Admissions: 8am-3pm M-F  [facility]  [address]  [place], [place] [address]  [phone]  [facility]  [address] [place]  [place], [place]  [phone]  [facility]  [address]  [place], [place]  [phone]  [facility] - Intensive Outpatient  [address]  [place], [place]  [phone]  [facility] ([facility]  [address]  [place], [place]  [phone] or [phone]  [facility]idential Treatment Services (RT[facility]), Medicaid  [address]  [place], [place]  [phone]  [facility] [address]  [address]  [place]  [phone]   [facility]  ACCESS LINE: [phone] or [phone]  [address]  [place], [place] [address]  [email or web address]   Substance Abuse Resources:  [facility] [phone]  [facility] [phone]  [facility] [place] [phone]  Floydene Flock [phone]  Residential & Outpatient Substance Abuse Program [phone]  Psychological Services:  [facility] [phone]  [facility] [phone]  [place], [place]. [address], [place], ACCESS LINE: [phone] or [phone], [email or web address]  Mobile Crisis Teams:  [facility]  Mobile Crisis Care Unit  [phone]  Assertive  Psychotherapeutic Services  [address] [place]  [phone]   Self-Help/Support Groups:  Mental [facility]oc[facility] of [employer] of support groups  [phone] (call for more info)  Narcotics Anonymous (NA)  [facility]aring [facility]  [address]  [place] - 2 meetings at this location      COLOGUARD INFORMATION  Colon cancer is 3rd most diagnosed cancer and 2nd leading cause of death in both men and women 7 years of age and older despite being one of the most preventable and treatable cancers if found early.  4 of out 5 people diagnosed with colon cancer have NO prior family history.  When caught EARLY 90% of colon cancer is curable.   More than 92% of cologuard patients have NO out of pocket cost for screening however only your insurer can confirm how Cologuard would be covered for you.  Cologuard has a team of specialist that can help you contact your insurer and ask the right questions.  PRIOR to completing the  test, it is YOUR responsibility to contact your insurance about covered benefits for this test. Your out of pocket expense could be anywhere from $0.00 to $649.00.  Please call [phone] so they can help.   When you call to check coverage with your insurer, please provide the following information:   -The ONLY provider of Cologuard is Optician, dispensing  - CPT code for Cologuard is [phone].  [facility] NPI # [id number]  -[employer] Tax ID # [id number]   We have already sent your demographic and insurance information to [employer] (phone number [phone]) and they should contact you within the next week regarding your test. If you have not heard from them within the next week, please call our office at [phone].  You will receive a short call from [employer] Customer support center at [employer], when you receive a call they will say they are from [employer],  to confirm your mailing address and give you more information.  When they calll you, it will appear on the caller ID as "[employer]" or in some cases only this number will appear, [phone].   [employer] will ship your collection kit directly to you. You will collect a single stool sample in the privacy of your own home, no special preparation required. You will return the kit via UPS pre-paid  shipping or pick-up, in the same box it arrived in. Then I will contact you to discuss your results after I receive them from the laboratory.   If you have any questions or concerns, Cologuard Customer Support Specialist are available 24 hours a day, 7 days a week at [phone] or go to [email or web address]       _______________________________________________________  If your blood pressure at your visit was 140/90 or greater, please contact your primary care physician to follow up on this.  _______________________________________________________  If  you are age 36 or older, your body mass index should be between 23-30. Your Body mass index is 21.65 kg/m. If this is out of the aforementioned range listed, please consider follow up with your Primary Care Provider.  If you are age 73 or younger, your body mass index should be between 19-25. Your Body mass index is 21.65 kg/m. If this is out of the aformentioned range listed, please consider follow up with your Primary Care Provider.   ________________________________________________________  The [facility] providers would like to encourage you to use [facility] to communicate with providers for non-urgent requests or questions.  Due to long hold times on the telephone, sending your provider a message by [facility] may be a faster and more efficient way to get a response.  Please allow 48 business hours for a response.  Please remember that this is for non-urgent requests.  _______________________________________________________ It was a pleasure to see you today!  Thank you for trusting me with your gastrointestinal care!

## 2023-08-10 NOTE — Telephone Encounter (Signed)
 Pt called to reschedule appointment due to was not able to keep appointment

## 2023-08-10 NOTE — Progress Notes (Signed)
 08/10/2023 Timothy Montgomery [id number] 04-26-1971  Referring provider: Glendora Score, MD Primary GI doctor: Dr. Barron Alvine  ASSESSMENT AND PLAN:   Esophageal Thickening Noted on CT scan after trauma. Likely secondary to alcohol-induced esophagitis, but need to rule out Barrett's esophagus or malignancy. -Schedule endoscopy , patient has difficult time with securing ride, will schedule when he able to make it. I discussed risks of EGD with patient today, including risk of sedation, bleeding or perforation.  Patient provides understanding and gave verbal consent to proceed. -Advise patient to avoid alcohol and NSAIDs.  Thrombocytopenia previous elevated LFTS in ETOH use Low platelets noted for past 9 months - possible bone marrow suppression from ETOH, CT without evidence of portal hypertension or cirrhosis - will get elastography, hepatitis A, B, C and vaccination status, check iron, ferritin, celiac, ASMA, AMA, IGG, ceruloplasmin -Monitor platelet count and liver function. - advised to stop ETOH use  Alcohol Use Daily liquor consumption, potentially contributing to esophageal thickening and concern for liver damage. -Advise patient to reduce or stop alcohol consumption. -Monitor liver function/CBC - get on MVIT - resources given, discuss with PCP  Colon Cancer Screening Patient is overdue for screening, with no prior colonoscopy or other screening methods used. -patient does not want colonoscopy at this time, prefer to not delay EGD - Long discussion with the patient about cologuard versus colonoscopy without family history, no personal history of polyps and no symptoms at this time will schedule for cologuard, discussed false positives with patient and he understands risk of that and possibly still needing a colonoscopy in the future.  Patient Care Team: Patient, No Pcp Per as PCP - General (General Practice) Patient, No Pcp Per (General Practice)  HISTORY OF PRESENT  ILLNESS:  Discussed the use of AI scribe software for clinical note transcription with the patient, who gave verbal consent to proceed.  History of Present Illness   Timothy Montgomery is a 53 year old male with history of alcohol abuse who presents with esophageal thickening.  Esophageal thickening was identified during a recent hospital visit. He has not previously seen a gastroenterologist and has no history of colonoscopy or endoscopy. Multiple CT scans, including head, cervical spine, chest, abdomen, pelvis, and maxillofacial without contrast, and a CT chest, abdomen, pelvis with contrast, revealed displaced rib fractures and marked diffuse mid to distal esophageal and gastroesophageal junction wall thickening, correlating with signs and symptoms of esophagitis. No evidence of acute intrathoracic, intraabdominal, or intrapelvic trauma or fracture was found. Normal liver, normal spleen, no evidence of portal hypertension.  He experiences occasional reflux or heartburn, particularly when eating excessively, but does not experience it daily and does not take medication for it. No trouble swallowing, food or pills getting caught, or any discomfort. No swelling in his legs or abdomen, yellowing of the eyes or skin, abdominal pain, diarrhea, constipation, blood in the stool, recent weight loss, or night sweats.  He has a history of alcohol use, consuming about half of a fifth of liquor approximately four days a week for the past four years. He quit smoking about five to six years ago and occasionally uses marijuana. He occasionally uses ibuprofen and Tylenol, especially after his recent rib fractures, but has started to reduce his intake. There is no known family history of colon or esophageal cancer.  No history of ETOH withdrawal.    RELEVANT GI HISTORY, IMAGING AND LABS: Results   LABS CBC: Thrombocytopenia  RADIOLOGY CT head (05/27/2023): No acute intrathoracic,  intraabdominal,  intrapelvic trauma or fracture or traumatic alignment of thoracic or lumbar spine. CT cervical spine (05/27/2023): No acute intrathoracic, intraabdominal, intrapelvic trauma or fracture or traumatic alignment of thoracic or lumbar spine. CT chest (05/27/2023): Displaced rib fractures and marked diffuse mid to distal esophageal and gastroesophageal junction wall thickening. No acute intrathoracic trauma. CT abdomen (05/27/2023): Displaced rib fractures and marked diffuse mid to distal esophageal and gastroesophageal junction wall thickening. No acute intraabdominal trauma. CT pelvis (05/27/2023): Displaced rib fractures and marked diffuse mid to distal esophageal and gastroesophageal junction wall thickening. No acute intrapelvic trauma. CT maxillofacial (05/27/2023): No acute intrathoracic, intraabdominal, intrapelvic trauma or fracture or traumatic alignment of thoracic or lumbar spine. CT chest with contrast (05/27/2023): Displaced rib fractures and marked diffuse mid to distal esophageal and gastroesophageal junction wall thickening. No acute intrathoracic trauma. CT abdomen with contrast (05/27/2023): Displaced rib fractures and marked diffuse mid to distal esophageal and gastroesophageal junction wall thickening. No acute intraabdominal trauma. CT pelvis with contrast (05/27/2023): Displaced rib fractures and marked diffuse mid to distal esophageal and gastroesophageal junction wall thickening. No acute intrapelvic trauma. CT abdomen with contrast (02/04/2023): Thickening of the sigmoid colon and distal descending colon, which may be in part secondary to under extension colitis within these regions cannot be excluded.      CBC    Component Value Date/Time   WBC 10.7 (H) 07/28/2023 1822   RBC 5.15 07/28/2023 1822   HGB 16.0 07/28/2023 1831   HCT 47.0 07/28/2023 1831   PLT 141 (L) 07/28/2023 1822   MCV 91.5 07/28/2023 1822   MCH 29.7 07/28/2023 1822   MCHC 32.5 07/28/2023 1822   RDW 13.7  07/28/2023 1822   LYMPHSABS 3.0 02/06/2023 0046   MONOABS 0.9 02/06/2023 0046   EOSABS 0.4 02/06/2023 0046   BASOSABS 0.1 02/06/2023 0046   Recent Labs    10/16/22 0905 01/28/23 1816 02/03/23 2343 02/06/23 0046 07/04/23 2006 07/28/23 1822 07/28/23 1831  HGB 13.9 15.5 14.2 15.5 16.0 15.3 16.0    CMP     Component Value Date/Time   NA 141 07/28/2023 1831   K 3.6 07/28/2023 1831   CL 109 07/28/2023 1831   CO2 20 (L) 07/28/2023 1822   GLUCOSE 113 (H) 07/28/2023 1831   BUN 11 07/28/2023 1831   CREATININE 1.30 (H) 07/28/2023 1831   CALCIUM 9.4 07/28/2023 1822   PROT 7.3 07/28/2023 1822   ALBUMIN 4.1 07/28/2023 1822   AST 25 07/28/2023 1822   ALT 28 07/28/2023 1822   ALKPHOS 84 07/28/2023 1822   BILITOT 0.5 07/28/2023 1822   GFRNONAA >60 07/28/2023 1822      Latest Ref Rng & Units 07/28/2023    6:22 PM 02/03/2023   11:43 PM 01/28/2023    6:16 PM  Hepatic Function  Total Protein 6.5 - 8.1 g/dL 7.3  6.5  7.3   Albumin 3.5 - 5.0 g/dL 4.1  3.6  4.1   AST 15 - 41 U/L 25  21  30    ALT 0 - 44 U/L 28  19  21    Alk Phosphatase 38 - 126 U/L 84  107  120   Total Bilirubin 0.0 - 1.2 mg/dL 0.5  0.6  0.8       Current Medications:    Current Outpatient Medications (Cardiovascular):    amLODipine (NORVASC) 10 MG tablet, Take 10 mg by mouth daily.   amLODipine (NORVASC) 5 MG tablet, Take 5 mg by mouth daily. (Patient not taking: Reported on 07/05/2023)  Current Outpatient Medications (Analgesics):    HYDROcodone-acetaminophen (NORCO/VICODIN) 5-325 MG tablet, Take 1 tablet by mouth every 4 (four) hours as needed. (Patient not taking: Reported on 08/10/2023)   HYDROcodone-acetaminophen (NORCO/VICODIN) 5-325 MG tablet, Take 2 tablets by mouth every 4 (four) hours as needed. (Patient not taking: Reported on 08/10/2023)   naproxen (NAPROSYN) 375 MG tablet, Take 1 tablet (375 mg total) by mouth 2 (two) times daily. (Patient not taking: Reported on 08/10/2023)   Current Outpatient  Medications (Other):    Lacosamide 100 MG TABS, Take 1 tablet (100 mg total) by mouth 2 (two) times daily.   pantoprazole (PROTONIX) 40 MG tablet, Take 1 tablet (40 mg total) by mouth 2 (two) times daily before a meal.   lidocaine (LIDODERM) 5 %, Place 1 patch onto the skin daily. Remove & Discard patch within 12 hours or as directed by MD (Patient not taking: Reported on 08/10/2023)  Medical History:  Past Medical History:  Diagnosis Date   HTN (hypertension)    Allergies:  Allergies  Allergen Reactions   Lisinopril Itching     Surgical History:  He  has no past surgical history on file. Family History:  His family history is not on file.  REVIEW OF SYSTEMS  : All other systems reviewed and negative except where noted in the History of Present Illness.  PHYSICAL EXAM: BP 114/74   Pulse 77   Ht 5\' 7"  (1.702 m)   Wt 138 lb 4 oz (62.7 kg)   BMI 21.65 kg/m  Physical Exam   GENERAL APPEARANCE: thin appearing, in no apparent distress. HEENT: No cervical lymphadenopathy, unremarkable thyroid, sclerae anicteric, conjunctiva pink., poor dentition RESPIRATORY: Respiratory effort normal, breath sounds clear bilaterally without rales, rhonchi, or wheezing. CARDIO: Regular rate and rhythm with no murmurs, rubs, or gallops, peripheral pulses intact. ABDOMEN: Soft, non-distended, active bowel sounds in all four quadrants, non-tender, no rebound, no mass appreciated, no fluid wave. RECTAL: Declines. MUSCULOSKELETAL: Full range of motion, normal gait, arthritis in hands, without edema. SKIN: Dry, intact without rashes or lesions. No jaundice. NEURO: Alert, oriented, no focal deficits.no asterixis. PSYCH: Cooperative, normal mood and affect     Doree Albee, PA-C 2:17 PM

## 2023-08-11 ENCOUNTER — Telehealth: Payer: Self-pay

## 2023-08-11 ENCOUNTER — Other Ambulatory Visit (HOSPITAL_COMMUNITY): Payer: Self-pay

## 2023-08-11 NOTE — Telephone Encounter (Signed)
 Pharmacy Patient Advocate Encounter   Received notification from Fax that prior authorization for Pantoprazole Sodium 40MG  dr tablets is required/requested.   Insurance verification completed.   The patient is insured through [employer] [facility] .   Per test claim: PA required; PA started via CoverMyMeds. KEY [id number] . Waiting for clinical questions to populate.

## 2023-08-12 ENCOUNTER — Encounter: Admitting: Internal Medicine

## 2023-08-13 LAB — IGG: IgG (Immunoglobin G), Serum: 947 mg/dL (ref 600–1640)

## 2023-08-13 LAB — ANTI-SMOOTH MUSCLE ANTIBODY, IGG: Actin (Smooth Muscle) Antibody (IGG): <20 U (ref <20)

## 2023-08-13 LAB — ANTI-NUCLEAR AB-TITER (ANA TITER): ANA Titer 1: 1:40 {titer} — ABNORMAL HIGH

## 2023-08-13 LAB — HEPATITIS A ANTIBODY, TOTAL: Hepatitis A AB,Total: NONREACTIVE

## 2023-08-13 LAB — COPPER, SERUM

## 2023-08-13 LAB — IGA: Immunoglobulin A: 340 mg/dL — ABNORMAL HIGH (ref 47–310)

## 2023-08-13 LAB — ANA: Anti Nuclear Antibody (ANA): POSITIVE — AB

## 2023-08-13 LAB — ALPHA-1-ANTITRYPSIN: A-1 Antitrypsin, Ser: 118 mg/dL (ref 83–199)

## 2023-08-13 LAB — HEPATITIS B SURFACE ANTIBODY,QUALITATIVE: Hep B S Ab: NONREACTIVE

## 2023-08-13 LAB — HEPATITIS B SURFACE ANTIGEN: Hepatitis B Surface Ag: NONREACTIVE

## 2023-08-13 LAB — TISSUE TRANSGLUTAMINASE, IGA: (tTG) Ab, IgA: <1 U/mL

## 2023-08-13 LAB — MITOCHONDRIAL ANTIBODIES: Mitochondrial M2 Ab, IgG: <=20 U (ref <=20.0)

## 2023-08-13 LAB — HEPATITIS C ANTIBODY: Hepatitis C Ab: NONREACTIVE

## 2023-08-14 ENCOUNTER — Ambulatory Visit (AMBULATORY_SURGERY_CENTER): Admitting: Internal Medicine

## 2023-08-14 ENCOUNTER — Encounter: Payer: Self-pay | Admitting: Internal Medicine

## 2023-08-14 VITALS — BP 119/78 | HR 61 | Temp 98.2°F | Resp 18 | Ht 67.0 in | Wt 138.0 lb

## 2023-08-14 DIAGNOSIS — K2100 Gastro-esophageal reflux disease with esophagitis, without bleeding: Secondary | ICD-10-CM | POA: Diagnosis not present

## 2023-08-14 DIAGNOSIS — K259 Gastric ulcer, unspecified as acute or chronic, without hemorrhage or perforation: Secondary | ICD-10-CM | POA: Diagnosis not present

## 2023-08-14 DIAGNOSIS — K269 Duodenal ulcer, unspecified as acute or chronic, without hemorrhage or perforation: Secondary | ICD-10-CM | POA: Diagnosis not present

## 2023-08-14 DIAGNOSIS — K449 Diaphragmatic hernia without obstruction or gangrene: Secondary | ICD-10-CM | POA: Diagnosis not present

## 2023-08-14 DIAGNOSIS — K2970 Gastritis, unspecified, without bleeding: Secondary | ICD-10-CM

## 2023-08-14 DIAGNOSIS — R933 Abnormal findings on diagnostic imaging of other parts of digestive tract: Secondary | ICD-10-CM

## 2023-08-14 DIAGNOSIS — K253 Acute gastric ulcer without hemorrhage or perforation: Secondary | ICD-10-CM

## 2023-08-14 DIAGNOSIS — K219 Gastro-esophageal reflux disease without esophagitis: Secondary | ICD-10-CM

## 2023-08-14 MED ORDER — SODIUM CHLORIDE 0.9 % IV SOLN
500.0000 mL | Freq: Once | INTRAVENOUS | Status: DC
Start: 2023-08-14 — End: 2023-08-14

## 2023-08-14 MED ORDER — OMEPRAZOLE 40 MG PO CPDR
40.0000 mg | DELAYED_RELEASE_CAPSULE | Freq: Every day | ORAL | 3 refills | Status: AC
Start: 2023-08-14 — End: ?

## 2023-08-14 NOTE — Progress Notes (Signed)
 Called to room to assist during endoscopic procedure.  Patient ID and intended procedure confirmed with present staff. Received instructions for my participation in the procedure from the performing physician.

## 2023-08-14 NOTE — Progress Notes (Signed)
 History and Physical Interval Note:  08/14/2023 10:19 AM  Timothy Montgomery  has presented today for endoscopic procedure(s), with the diagnosis of  Encounter Diagnosis  Name Primary?   Gastroesophageal reflux disease with esophagitis, unspecified whether hemorrhage Yes  .  The various methods of evaluation and treatment have been discussed with the patient and/or family. After consideration of risks, benefits and other options for treatment, the patient has consented to  the endoscopic procedure(s).   The patient's history has been reviewed, patient examined, no change in status, stable for endoscopic procedure(s).  I have reviewed the patient's chart and labs.  Questions were answered to the patient's satisfaction.     Iva Boop, MD, [facility]

## 2023-08-14 NOTE — Progress Notes (Signed)
 Pt's states no medical or surgical changes since previsit or office visit.

## 2023-08-14 NOTE — Op Note (Signed)
 [facility] Patient Name: Timothy Montgomery Procedure Date: 08/14/2023 10:20 AM MRN: [id number] Endoscopist: Iva Boop , MD, [id number] Age: 53 Referring MD:  Date of Birth: 1971-06-08 Gender: Male Account #: [id number] Procedure:                Upper GI endoscopy Indications:              Suspected esophageal reflux, Abnormal CT of the GI                            tract Medicines:                Monitored Anesthesia Care Procedure:                Pre-Anesthesia Assessment:                           - Prior to the procedure, a History and Physical                            was performed, and patient medications and                            allergies were reviewed. The patient's tolerance of                            previous anesthesia was also reviewed. The risks                            and benefits of the procedure and the sedation                            options and risks were discussed with the patient.                            All questions were answered, and informed consent                            was obtained. Prior Anticoagulants: The patient has                            taken no anticoagulant or antiplatelet agents. ASA                            Grade Assessment: II - A patient with mild systemic                            disease. After reviewing the risks and benefits,                            the patient was deemed in satisfactory condition to                            undergo the procedure.  After obtaining informed consent, the endoscope was                            passed under direct vision. Throughout the                            procedure, the patient's blood pressure, pulse, and                            oxygen saturations were monitored continuously. The                            Olympus Scope SN [id number] was introduced through the                            mouth, and advanced to the second part of  duodenum.                            The upper GI endoscopy was accomplished without                            difficulty. The patient tolerated the procedure                            well. Scope In: Scope Out: Findings:                 Two non-bleeding cratered duodenal ulcers with no                            stigmata of bleeding were found in the duodenal                            bulb and in the second portion of the duodenum. The                            largest lesion was 10 mm in largest dimension.                           One non-bleeding cratered gastric ulcer with no                            stigmata of bleeding was found in the gastric                            antrum. The lesion was 4 mm in largest dimension.                           Diffuse severe inflammation characterized by                            congestion (edema) and erosions was found in the  entire examined stomach. Biopsies were taken with a                            cold forceps for histology. Verification of patient                            identification for the specimen was done. Estimated                            blood loss was minimal.                           A small hiatal hernia was present.                           The gastroesophageal flap valve was visualized                            endoscopically and classified as Hill Grade IV (no                            fold, wide open lumen, hiatal hernia present).                           The cardia and gastric fundus were otherwise normal                            on retroflexion.                           The exam was otherwise without abnormality. Complications:            No immediate complications. Estimated Blood Loss:     Estimated blood loss was minimal. Impression:               - Non-bleeding duodenal ulcers with no stigmata of                            bleeding.                           -  Non-bleeding gastric ulcer with no stigmata of                            bleeding.                           - Gastritis. Biopsied.                           - Small hiatal hernia.                           - Gastroesophageal flap valve classified as Hill                            Grade IV (no fold, wide  open lumen, hiatal hernia                            present).                           - The examination was otherwise normal. Recommendation:           - Patient has a contact number available for                            emergencies. The signs and symptoms of potential                            delayed complications were discussed with the                            patient. Return to normal activities tomorrow.                            Written discharge instructions were provided to the                            patient.                           - Resume previous diet.                           - Start omeprazole 40 mg daily - Ihe has a                            pantoprazole rx pending prior autorization so will                            try omeprazole instead and advised get OTC 20 mg x                            2 if cannot get 40 mg Rx                           wait pathology                           Stop alcohol and no NSAIDS/ASA if using - he did                            have Naprosyn on list from February so this could                            be causative - was not taking per med                            reconmciliation - I dced from list Iva Boop, MD 08/14/2023 10:56:19 AM This report has been signed electronically.

## 2023-08-14 NOTE — Progress Notes (Signed)
 Vss nad trans to pacu

## 2023-08-14 NOTE — Patient Instructions (Addendum)
 The esophagus looks fine.  You have multiple ulcers in the upper intestine (duodenum) + significant irritation in the stomach with one tiny ulcer. You are at risk of having severe bleeding if not treated.  I took biopsies to understand why but alcohol and anti-inflammatory meds like aspirin, Advil, ibuprofen, Goody's, CBC powders, aleve cand do this also. Stopping alcohol as advised before and avoiding these medications is important so do that.  Quentin Mulling had prescribed pantoprazole on 3/3 but hat is hung up in a prior authorization - I have sent an omeprazole prescription to [facility] instead.  PLEASE GET THAT TODAY AND START IT. IF TOO EXPENSIVE YOU CAN BUY OVER THE COUNTER OMEPRAZOLE 20 MG AND TAKE 2 A DAY  Will let you know biopsy results and other plans.  I appreciate the opportunity to care for you. Iva Boop, MD, [facility]   Handout provided on gastritis and hiatal hernia.  Resume previous diet.  Continue present medications, including the above mentioned Omeprazole 40mg  daily.   YOU HAD AN ENDOSCOPIC PROCEDURE TODAY AT THE [facility]:   Refer to the procedure report that was given to you for any specific questions about what was found during the examination.  If the procedure report does not answer your questions, please call your gastroenterologist to clarify.  If you requested that your care partner not be given the details of your procedure findings, then the procedure report has been included in a sealed envelope for you to review at your convenience later.  YOU SHOULD EXPECT: Some feelings of bloating in the abdomen. Passage of more gas than usual.  Walking can help get rid of the air that was put into your GI tract during the procedure and reduce the bloating. If you had a lower endoscopy (such as a colonoscopy or flexible sigmoidoscopy) you may notice spotting of blood in your stool or on the toilet paper. If you underwent a bowel prep for your procedure, you  may not have a normal bowel movement for a few days.  Please Note:  You might notice some irritation and congestion in your nose or some drainage.  This is from the oxygen used during your procedure.  There is no need for concern and it should clear up in a day or so.  SYMPTOMS TO REPORT IMMEDIATELY:  Following upper endoscopy (EGD)  Vomiting of blood or coffee ground material  New chest pain or pain under the shoulder blades  Painful or persistently difficult swallowing  New shortness of breath  Fever of 100F or higher  Black, tarry-looking stools  For urgent or emergent issues, a gastroenterologist can be reached at any hour by calling [phone]. Do not use MyChart messaging for urgent concerns.    DIET:  We do recommend a small meal at first, but then you may proceed to your regular diet.  Drink plenty of fluids but you should avoid alcoholic beverages for 24 hours.  ACTIVITY:  You should plan to take it easy for the rest of today and you should NOT DRIVE or use heavy machinery until tomorrow (because of the sedation medicines used during the test).    FOLLOW UP: Our staff will call the number listed on your records the next business day following your procedure.  We will call around 7:15- 8:00 am to check on you and address any questions or concerns that you may have regarding the information given to you following your procedure. If we do not reach you, we  will leave a message.     If any biopsies were taken you will be contacted by phone or by letter within the next 1-3 weeks.  Please call us at [phone] if you have not heard about the biopsies in 3 weeks.    SIGNATURES/CONFIDENTIALITY: You and/or your care partner have signed paperwork which will be entered into your electronic medical record.  These signatures attest to the fact that that the information above on your After Visit Summary has been reviewed and is understood.  Full responsibility of the confidentiality  of this discharge information lies with you and/or your care-partner.

## 2023-08-17 ENCOUNTER — Telehealth: Payer: Self-pay | Admitting: Pharmacy Technician

## 2023-08-17 ENCOUNTER — Telehealth: Payer: Self-pay

## 2023-08-17 ENCOUNTER — Other Ambulatory Visit (HOSPITAL_COMMUNITY): Payer: Self-pay

## 2023-08-17 NOTE — Telephone Encounter (Signed)
 Pharmacy Patient Advocate Encounter   Received notification from CoverMyMeds that prior authorization for PANTOPRAZOLE 40MG  is required/requested.   Insurance verification completed.   The patient is insured through [employer] [facility] .   Per test claim: PA required; PA submitted to above mentioned insurance via CoverMyMeds Key/confirmation #/EOC [id number] Status is pending

## 2023-08-17 NOTE — Telephone Encounter (Signed)
 Left message on answering machine.

## 2023-08-19 ENCOUNTER — Encounter: Payer: Self-pay | Admitting: *Deleted

## 2023-08-19 ENCOUNTER — Ambulatory Visit: Admitting: Neurology

## 2023-08-19 DIAGNOSIS — G40909 Epilepsy, unspecified, not intractable, without status epilepticus: Secondary | ICD-10-CM | POA: Diagnosis not present

## 2023-08-19 LAB — SURGICAL PATHOLOGY

## 2023-08-19 NOTE — Procedures (Signed)
   History:  53 year old man with seizure   EEG classification:  Awake and asleep  Duration: 25 minutes   Technical aspects: This EEG study was done with scalp electrodes positioned according to the 10-20 International system of electrode placement. Electrical activity was reviewed with band pass filter of 1-70Hz , sensitivity of 7 uV/mm, display speed of 68mm/sec with a 60Hz  notched filter applied as appropriate. EEG data were recorded continuously and digitally stored.   Description of the recording: The background rhythms of this recording consists of low amplitude with no clear PDR. As the record progresses, the patient initially is in the waking state, but appears to enter the early stage II sleep during the recording, with rudimentary sleep spindles and vertex sharp wave activity seen. During the wakeful state, photic stimulation was performed, and no abnormal responses were seen. Hyperventilation was also performed, no abnormal response seen. No epileptiform discharges seen during this recording. There was no focal slowing.   Abnormality: None   Impression: This is a normal awake and sleep EEG. No evidence of interictal epileptiform discharges. Normal EEGs, however, do not rule out epilepsy.    Windell Norfolk, MD [facility]

## 2023-08-21 ENCOUNTER — Telehealth: Payer: Self-pay | Admitting: Anesthesiology

## 2023-08-21 NOTE — Telephone Encounter (Signed)
 Called pt and left a voicemail to return our call regarding EEG results.

## 2023-08-21 NOTE — Progress Notes (Signed)
Please call and inform patient that his recent EEG (Brain wave test) was normal. In particular, there were no epileptiform discharges and no seizures. No further action is required on this test at this time. Please keep any upcoming appointments or tests and  call us with any interim questions, concerns, problems or updates. Thanks,   Emmie Frakes, MD 

## 2023-08-21 NOTE — Telephone Encounter (Signed)
-----   Message from [facility] sent at 08/21/2023 10:48 AM EDT ----- Please call and inform patient that his recent EEG (Brain wave test) was normal. In particular, there were no epileptiform discharges and no seizures. No further action is required on this test at this time. Please keep any upcoming appointments or tests and  call us with any interim questions, concerns, problems or updates. Thanks,   Windell Norfolk, MD

## 2023-08-26 NOTE — Telephone Encounter (Signed)
 Pharmacy Patient Advocate Encounter  Received notification from [facility] that Prior Authorization for PANTOPRAZOLE 40MG  has been DENIED.  Full denial letter will be uploaded to the media tab. See denial reason below.  Criteria for medical necessity were not met. Your records with [place] and the information submitted by your doctor do not show you meet the following criteria: (1) trial and failure for at least one month or unable to use two of the following: lansoprazole, omeprazole, and pantoprazole, (2) your doctor's explanation why you need a quantity of 60 tablets per 30 days (Please note: the plan's quantity limit is 30 tablets per 30 days), (3) the prescribed dose, frequency, and treatment duration are consistent with one of the following: a) Food and Drug Administration (FDA)-approved labeling, b) nationally recognized compendia (a collection of clinical data and evidence), c) clinical practice guidelines from nationally recognized professional associations or governmental agencies, or d) peer-reviewed medical literature from a major scientific or medical publication, (4) documentation the requested dose or frequency cannot be achieved using a different strength or formulation covered by the plan's quantity limits, (5) documentation of one of the following: a) trial and failure for at least three months of the requested drug within the plan's quantity limit (once daily dosing),b) requirement of a higher dose due to clinical circumstances (such as variations in weight or metabolism or disease severity), or c) condition being treated requires a higher dose than the plan's quantity limit, (6) documentation of evidence supporting the safety and effectiveness of the higher dose being requested [for requests exceeding FDA max only], and (7) the requested medication is being used for a Food and Drug Administration (FDA)- approved or compendia (a collection of clinical data and  evidence)-supported indication.  PA #/Case ID/Reference #:  [id number]

## 2023-09-01 ENCOUNTER — Emergency Department (HOSPITAL_COMMUNITY)
Admission: EM | Admit: 2023-09-01 | Discharge: 2023-09-02 | Disposition: A | Discharge status: Home / Self Care | Attending: Emergency Medicine | Admitting: Emergency Medicine

## 2023-09-01 ENCOUNTER — Other Ambulatory Visit: Payer: Self-pay

## 2023-09-01 DIAGNOSIS — I10 Essential (primary) hypertension: Secondary | ICD-10-CM | POA: Diagnosis not present

## 2023-09-01 DIAGNOSIS — Z79899 Other long term (current) drug therapy: Secondary | ICD-10-CM | POA: Insufficient documentation

## 2023-09-01 DIAGNOSIS — R569 Unspecified convulsions: Secondary | ICD-10-CM | POA: Diagnosis present

## 2023-09-01 LAB — RAPID URINE DRUG SCREEN, HOSP PERFORMED
Amphetamines: POSITIVE — AB
Barbiturates: NOT DETECTED
Benzodiazepines: NOT DETECTED
Cocaine: NOT DETECTED
Opiates: NOT DETECTED
Tetrahydrocannabinol: POSITIVE — AB

## 2023-09-01 LAB — CBC WITH DIFFERENTIAL/PLATELET
Abs Immature Granulocytes: 0.07 10*3/uL (ref 0.00–0.07)
Basophils Absolute: 0.1 10*3/uL (ref 0.0–0.1)
Basophils Relative: 1 %
Eosinophils Absolute: 0.5 10*3/uL (ref 0.0–0.5)
Eosinophils Relative: 5 %
HCT: 41.9 % (ref 39.0–52.0)
Hemoglobin: 13.8 g/dL (ref 13.0–17.0)
Immature Granulocytes: 1 %
Lymphocytes Relative: 14 %
Lymphs Abs: 1.3 10*3/uL (ref 0.7–4.0)
MCH: 29.9 pg (ref 26.0–34.0)
MCHC: 32.9 g/dL (ref 30.0–36.0)
MCV: 90.7 fL (ref 80.0–100.0)
Monocytes Absolute: 0.8 10*3/uL (ref 0.1–1.0)
Monocytes Relative: 8 %
Neutro Abs: 6.7 10*3/uL (ref 1.7–7.7)
Neutrophils Relative %: 71 %
Platelets: 131 10*3/uL — ABNORMAL LOW (ref 150–400)
RBC: 4.62 MIL/uL (ref 4.22–5.81)
RDW: 13.8 % (ref 11.5–15.5)
WBC: 9.3 10*3/uL (ref 4.0–10.5)
nRBC: 0 % (ref 0.0–0.2)

## 2023-09-01 LAB — COMPREHENSIVE METABOLIC PANEL
ALT: 26 U/L (ref 0–44)
AST: 27 U/L (ref 15–41)
Albumin: 3.6 g/dL (ref 3.5–5.0)
Alkaline Phosphatase: 85 U/L (ref 38–126)
Anion gap: 12 (ref 5–15)
BUN: 20 mg/dL (ref 6–20)
CO2: 19 mmol/L — ABNORMAL LOW (ref 22–32)
Calcium: 9 mg/dL (ref 8.9–10.3)
Chloride: 107 mmol/L (ref 98–111)
Creatinine, Ser: 1.29 mg/dL — ABNORMAL HIGH (ref 0.61–1.24)
GFR, Estimated: >60 mL/min (ref >60)
Glucose, Bld: 91 mg/dL (ref 70–99)
Potassium: 3.5 mmol/L (ref 3.5–5.1)
Sodium: 138 mmol/L (ref 135–145)
Total Bilirubin: 0.5 mg/dL (ref 0.0–1.2)
Total Protein: 6.4 g/dL — ABNORMAL LOW (ref 6.5–8.1)

## 2023-09-01 LAB — URINALYSIS, ROUTINE W REFLEX MICROSCOPIC
Bacteria, UA: NONE SEEN
Bilirubin Urine: NEGATIVE
Glucose, UA: NEGATIVE mg/dL
Hgb urine dipstick: NEGATIVE
Ketones, ur: NEGATIVE mg/dL
Leukocytes,Ua: NEGATIVE
Nitrite: NEGATIVE
Protein, ur: 30 mg/dL — AB
Specific Gravity, Urine: 1.018 (ref 1.005–1.030)
pH: 5 (ref 5.0–8.0)

## 2023-09-01 LAB — ETHANOL: Alcohol, Ethyl (B): <10 mg/dL (ref <10)

## 2023-09-01 LAB — MAGNESIUM: Magnesium: 2.2 mg/dL (ref 1.7–2.4)

## 2023-09-01 MED ORDER — SODIUM CHLORIDE 0.9 % IV SOLN
200.0000 mg | INTRAVENOUS | Status: AC
  Administered 2023-09-01: 200 mg via INTRAVENOUS
  Filled 2023-09-01: qty 20

## 2023-09-01 MED ORDER — LACOSAMIDE 100 MG PO TABS: 100.0000 mg | ORAL_TABLET | Freq: Two times a day (BID) | ORAL | 0 refills | Status: DC

## 2023-09-01 NOTE — Discharge Instructions (Signed)
 You were seen for your seizure in the emergency department.   At home, please continue to take the seizure medication that you have been prescribed.  Refrain from activities that could be dangerous when you have a seizure such as climbing a ladder or swimming.  Do not drive for at least 6 months.  Talk to your neurologist to see when it is safe to resume these activities.    Check your MyChart online for the results of any tests that had not resulted by the time you left the emergency department.   Follow-up with your primary doctor in 2-3 days regarding your visit.  Follow-up with your neurologist in 1 week.  Return immediately to the emergency department if you experience any of the following: Seizures lasting more than 5 minutes, or any other concerning symptoms.    Thank you for visiting our Emergency Department. It was a pleasure taking care of you today.

## 2023-09-01 NOTE — ED Provider Notes (Signed)
 [place] EMERGENCY DEPARTMENT AT [facility] Provider Note   CSN: [id number] Arrival date & time: 09/01/23  2031     History  Chief Complaint  Patient presents with   Seizures    Patient brought in by [facility] EMS. Per EMS patient was at work [employer]) and found slumped over a desk breathing heavy. Postictal with medic. Patient stated that he has been out of his seizure medications for a while. No meds administered with medic. Hx drug and alcohol abuse 20 LAC a/o x3    Timothy Montgomery is a 53 y.o. male.  53 year old male with history of hypertension and possible seizures (undergoing workup) on lacosamide who presents emergency department after being found down.  Patient was working at [employer].  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  There was some mention that he may have had a seizure but no one actually witnessed the patient shaking.  They are unsure if he overdosed on something or had another event.  Patient does not recall what happened.  Denies any recreational drug use aside from occasional alcohol and marijuana.  Says that he has not had any heavy alcohol use recently.  Has been out of his lacosamide 100 twice daily for several days.  Was confused for EMS.  Since his last seizure was approximately 4 months ago.       Home Medications Prior to Admission medications   Medication Sig Start Date End Date Taking? Authorizing Provider  amLODipine (NORVASC) 10 MG tablet Take 10 mg by mouth daily. 06/23/23   [provider]  Lacosamide 100 MG TABS Take 1 tablet (100 mg total) by mouth 2 (two) times daily. 09/01/23 02/28/24  Rondel Baton, MD  omeprazole (PRILOSEC) 40 MG capsule Take 1 capsule (40 mg total) by mouth daily before breakfast. 08/14/23   Iva Boop, MD      Allergies    Lisinopril    Review of Systems   Review of Systems  Physical Exam Updated Vital Signs BP  (!) 111/59 (BP Location: Right Arm)   Pulse 76   Temp (!) 97.5 F (36.4 C) (Oral)   Resp 16   SpO2 98%  Physical Exam Vitals and nursing note reviewed.  Constitutional:      General: He is not in acute distress.    Appearance: He is well-developed.  HENT:     Head: Normocephalic and atraumatic.     Right Ear: External ear normal.     Left Ear: External ear normal.     Nose: Nose normal.     Mouth/Throat:     Mouth: Mucous membranes are moist.     Pharynx: Oropharynx is clear.  Eyes:     Extraocular Movements: Extraocular movements intact.     Conjunctiva/sclera: Conjunctivae normal.     Pupils: Pupils are equal, round, and reactive to light.  Neck:     Comments: No C-spine midline tenderness palpation Cardiovascular:     Rate and Rhythm: Normal rate and regular rhythm.     Heart sounds: Normal heart sounds.  Pulmonary:     Effort: Pulmonary effort is normal. No respiratory distress.     Breath sounds: Normal breath sounds.  Musculoskeletal:     Cervical back: Normal range of motion and neck supple.     Right lower leg: No edema.     Left lower leg: No edema.  Skin:    General:  Skin is warm and dry.  Neurological:     General: No focal deficit present.     Mental Status: He is alert and oriented to person, place, and time. Mental status is at baseline.     Cranial Nerves: No cranial nerve deficit.     Sensory: No sensory deficit.     Motor: No weakness.  Psychiatric:        Mood and Affect: Mood normal.        Behavior: Behavior normal.     ED Results / Procedures / Treatments   Labs (all labs ordered are listed, but only abnormal results are displayed) Labs Reviewed  COMPREHENSIVE METABOLIC PANEL - Abnormal; Notable for the following components:      Result Value   CO2 19 (*)    Creatinine, Ser 1.29 (*)    Total Protein 6.4 (*)    All other components within normal limits  CBC WITH DIFFERENTIAL/PLATELET - Abnormal; Notable for the following components:    Platelets 131 (*)    All other components within normal limits  URINALYSIS, ROUTINE W REFLEX MICROSCOPIC - Abnormal; Notable for the following components:   APPearance HAZY (*)    Protein, ur 30 (*)    All other components within normal limits  RAPID URINE DRUG SCREEN, HOSP PERFORMED - Abnormal; Notable for the following components:   Amphetamines POSITIVE (*)    Tetrahydrocannabinol POSITIVE (*)    All other components within normal limits  MAGNESIUM  ETHANOL  CBG MONITORING, ED    EKG EKG Interpretation Date/Time:  Tuesday September 01 2023 21:37:04 EDT Ventricular Rate:  68 PR Interval:  140 QRS Duration:  96 QT Interval:  420 QTC Calculation: 447 R Axis:   52  Text Interpretation: Sinus rhythm Confirmed by Vanetta Mulders [phone]) on 09/01/2023 9:44:36 PM  Radiology No results found.  Procedures Procedures    Medications Ordered in ED Medications  lacosamide (VIMPAT) 200 mg in sodium chloride 0.9 % 25 mL IVPB (200 mg Intravenous New Bag/Given 09/01/23 2143)    ED Course/ Medical Decision Making/ A&P                                 Medical Decision Making Amount and/or Complexity of Data Reviewed Labs: ordered.  Risk Prescription drug management.   Timothy Montgomery is a 53 y.o. male with comorbidities that complicate the patient evaluation including hypertension and possible seizures (undergoing workup) on lacosamide who presents emergency department after being found down.    Initial Ddx:  Seizure, syncope, overdose, TBI  MDM/Course:  Patient presents emergency department due to concerns for seizure-like activity.  EMS reported that he may have been postictal today as well.  Does report to me that he has a history of seizures but appears that he is undergoing a workup for this.  Last EEG was normal.  Was started on lacosamide by neurology because of his concerning history.  Overall well-appearing on exam.  No signs of traumatic brain injury or head injury.   Has a nonfocal neuroexam.  EKG without concerning findings such as Brugada, long QT, or WPW that would have caused his loss of consciousness.  Has already had to have an imaging that has not showed any abnormalities to explain seizures.  Labs without significant electrolyte abnormalities that would cause seizures.  Urine drug screen without opiates but did show that he had some amphetamines in his urine.  Patient  loaded with IV Vimpat per pharmacy recommendation. Upon re-evaluation back to baseline. Vimpat sent to his pharmacy.  Explained to him that he should not drive for 6 months.  Will have him follow-up with neurology as well.    This patient presents to the ED for concern of complaints listed in HPI, this involves an extensive number of treatment options, and is a complaint that carries with it a high risk of complications and morbidity. Disposition including potential need for admission considered.   Dispo: DC Home. Return precautions discussed including, but not limited to, those listed in the AVS. Allowed pt time to ask questions which were answered fully prior to dc.  Additional history obtained from EMS Records reviewed Outpatient Clinic Notes The following labs were independently interpreted: Chemistry and show no acute abnormality I personally reviewed and interpreted cardiac monitoring: normal sinus rhythm  I personally reviewed and interpreted the pt's EKG: see above for interpretation  I have reviewed the patients home medications and made adjustments as needed  Portions of this note were generated with Dragon dictation software. Dictation errors may occur despite best attempts at proofreading.     Final Clinical Impression(s) / ED Diagnoses Final diagnoses:  Seizure-like activity (HCC)    Rx / DC Orders ED Discharge Orders          Ordered    Ambulatory referral to Neurology       Comments: An appointment is requested in approximately: 1 week   09/01/23 2226     Lacosamide 100 MG TABS  2 times daily        09/01/23 2228              Rondel Baton, MD 09/01/23 2240

## 2023-09-02 NOTE — ED Notes (Signed)
 Patient d/c with home care instructions. Patient called mom to get him a cab to get home

## 2023-09-03 ENCOUNTER — Other Ambulatory Visit: Payer: Self-pay | Admitting: Neurology

## 2023-09-03 ENCOUNTER — Telehealth: Payer: Self-pay | Admitting: Neurology

## 2023-09-03 ENCOUNTER — Telehealth: Payer: Self-pay

## 2023-09-03 MED ORDER — LACOSAMIDE 100 MG PO TABS
100.0000 mg | ORAL_TABLET | Freq: Two times a day (BID) | ORAL | 5 refills | Status: DC
Start: 2023-09-03 — End: 2023-09-10

## 2023-09-03 NOTE — Telephone Encounter (Signed)
 Juliana @[facility]  has called for a refill for pt's Lacosamide 100 MG TABS  to [facility]#[phone]  [facility]

## 2023-09-03 NOTE — Telephone Encounter (Signed)
error 

## 2023-09-03 NOTE — Telephone Encounter (Signed)
 Last seen 08/19/23, next appt 12/31/23 Dispenses   Dispensed Days Supply Quantity Provider Pharmacy  LACOSAMIDE 100MG     TAB 07/23/2023 30 60 each Windell Norfolk, MD [facility] [phone] ...  LACOSAMIDE 100MG     TAB 06/23/2023 30 60 each Windell Norfolk, MD [facility] [phone] .Marland KitchenMarland Kitchen

## 2023-09-04 ENCOUNTER — Telehealth: Payer: Self-pay

## 2023-09-04 ENCOUNTER — Other Ambulatory Visit (HOSPITAL_COMMUNITY): Payer: Self-pay

## 2023-09-04 NOTE — Telephone Encounter (Signed)
 Pharmacy Patient Advocate Encounter   Received notification from Fax that prior authorization for Lacosamide 100MG  tablets is required/requested.   Insurance verification completed.   The patient is insured through [facility] .   Per test claim: PA required; PA submitted to above mentioned insurance via CoverMyMeds Key/confirmation #/EOC [id number] Status is pending

## 2023-09-10 ENCOUNTER — Other Ambulatory Visit: Payer: Self-pay | Admitting: Neurology

## 2023-09-10 MED ORDER — LEVETIRACETAM 500 MG PO TABS: 500.0000 mg | ORAL_TABLET | Freq: Two times a day (BID) | ORAL | 3 refills | Status: AC

## 2023-09-10 NOTE — Telephone Encounter (Signed)
 Pharmacy Patient Advocate Encounter  Received notification from [facility] that Prior Authorization for Lacosamide 100MG  tablets has been DENIED.  Full denial letter will be uploaded to the media tab. See denial reason below.    PA #/Case ID/Reference #: PA Case ID #: [id number]

## 2023-09-10 NOTE — Telephone Encounter (Signed)
 LMTRC 1ST ATTEMPT BY HF UPON CALLBACK PLEASE RELAY THE FOLLOWING MSG;  Lacosamide not covered, we will try patient on Levetiracetam. Please advise patient to take 1/2 table twice daily for 2 weeks then increase to full tablet twice daily. Thanks

## 2023-09-10 NOTE — Telephone Encounter (Signed)
 Lacosamide not covered, we will try patient on Levetiracetam. Please advise patient to take 1/2 table twice daily for 2 weeks then increase to full tablet twice daily. Thanks

## 2023-09-11 NOTE — Telephone Encounter (Signed)
 LMTRC 2nd  ATTEMPT BY HF UPON CALLBACK PLEASE RELAY THE FOLLOWING MSG;   Lacosamide not covered, we will try patient on Levetiracetam. Please advise patient to take 1/2 table twice daily for 2 weeks then increase to full tablet twice daily. Thank

## 2023-09-14 NOTE — Telephone Encounter (Signed)
 Lmtrc 3rd and final attempt

## 2023-09-16 ENCOUNTER — Ambulatory Visit: Payer: 59 | Admitting: Physician Assistant

## 2023-09-18 ENCOUNTER — Institutional Professional Consult (permissible substitution): Admitting: Neurology

## 2023-10-29 ENCOUNTER — Telehealth: Payer: Self-pay

## 2023-10-29 NOTE — Telephone Encounter (Signed)
 Notified by AON that unable to get in touch with patient, mom said patient had a new number but unable to provide it. Mom number is [phone]. AON updated

## 2023-11-09 ENCOUNTER — Emergency Department (HOSPITAL_COMMUNITY): Payer: MEDICAID

## 2023-11-09 ENCOUNTER — Other Ambulatory Visit (HOSPITAL_COMMUNITY): Payer: Self-pay

## 2023-11-09 ENCOUNTER — Inpatient Hospital Stay (HOSPITAL_COMMUNITY)
Admission: EM | Admit: 2023-11-09 | Discharge: 2023-11-25 | DRG: 896 | Disposition: A | Discharge status: Home / Self Care | Payer: MEDICAID | Attending: Internal Medicine | Admitting: Internal Medicine

## 2023-11-09 ENCOUNTER — Other Ambulatory Visit: Payer: Self-pay

## 2023-11-09 DIAGNOSIS — F10239 Alcohol dependence with withdrawal, unspecified: Secondary | ICD-10-CM | POA: Diagnosis not present

## 2023-11-09 DIAGNOSIS — F1510 Other stimulant abuse, uncomplicated: Secondary | ICD-10-CM | POA: Diagnosis present

## 2023-11-09 DIAGNOSIS — R339 Retention of urine, unspecified: Secondary | ICD-10-CM | POA: Diagnosis not present

## 2023-11-09 DIAGNOSIS — G928 Other toxic encephalopathy: Secondary | ICD-10-CM | POA: Diagnosis present

## 2023-11-09 DIAGNOSIS — F05 Delirium due to known physiological condition: Secondary | ICD-10-CM | POA: Diagnosis not present

## 2023-11-09 DIAGNOSIS — E876 Hypokalemia: Secondary | ICD-10-CM | POA: Diagnosis present

## 2023-11-09 DIAGNOSIS — Z7401 Bed confinement status: Secondary | ICD-10-CM

## 2023-11-09 DIAGNOSIS — D696 Thrombocytopenia, unspecified: Secondary | ICD-10-CM | POA: Diagnosis not present

## 2023-11-09 DIAGNOSIS — G40909 Epilepsy, unspecified, not intractable, without status epilepticus: Secondary | ICD-10-CM | POA: Diagnosis present

## 2023-11-09 DIAGNOSIS — I5031 Acute diastolic (congestive) heart failure: Secondary | ICD-10-CM | POA: Diagnosis not present

## 2023-11-09 DIAGNOSIS — R32 Unspecified urinary incontinence: Secondary | ICD-10-CM | POA: Diagnosis present

## 2023-11-09 DIAGNOSIS — F1590 Other stimulant use, unspecified, uncomplicated: Secondary | ICD-10-CM | POA: Diagnosis not present

## 2023-11-09 DIAGNOSIS — F1090 Alcohol use, unspecified, uncomplicated: Secondary | ICD-10-CM | POA: Diagnosis not present

## 2023-11-09 DIAGNOSIS — F1210 Cannabis abuse, uncomplicated: Secondary | ICD-10-CM | POA: Diagnosis present

## 2023-11-09 DIAGNOSIS — I10 Essential (primary) hypertension: Secondary | ICD-10-CM | POA: Diagnosis present

## 2023-11-09 DIAGNOSIS — I959 Hypotension, unspecified: Secondary | ICD-10-CM | POA: Diagnosis not present

## 2023-11-09 DIAGNOSIS — F10231 Alcohol dependence with withdrawal delirium: Principal | ICD-10-CM | POA: Diagnosis present

## 2023-11-09 DIAGNOSIS — R410 Disorientation, unspecified: Secondary | ICD-10-CM | POA: Diagnosis not present

## 2023-11-09 DIAGNOSIS — I160 Hypertensive urgency: Secondary | ICD-10-CM | POA: Diagnosis present

## 2023-11-09 DIAGNOSIS — E7220 Disorder of urea cycle metabolism, unspecified: Secondary | ICD-10-CM | POA: Diagnosis not present

## 2023-11-09 DIAGNOSIS — F10939 Alcohol use, unspecified with withdrawal, unspecified: Secondary | ICD-10-CM | POA: Diagnosis present

## 2023-11-09 DIAGNOSIS — L03114 Cellulitis of left upper limb: Secondary | ICD-10-CM | POA: Diagnosis present

## 2023-11-09 DIAGNOSIS — J690 Pneumonitis due to inhalation of food and vomit: Secondary | ICD-10-CM | POA: Diagnosis not present

## 2023-11-09 DIAGNOSIS — R569 Unspecified convulsions: Secondary | ICD-10-CM

## 2023-11-09 DIAGNOSIS — E785 Hyperlipidemia, unspecified: Secondary | ICD-10-CM | POA: Diagnosis present

## 2023-11-09 DIAGNOSIS — R52 Pain, unspecified: Secondary | ICD-10-CM | POA: Diagnosis not present

## 2023-11-09 DIAGNOSIS — Z781 Physical restraint status: Secondary | ICD-10-CM

## 2023-11-09 DIAGNOSIS — R443 Hallucinations, unspecified: Secondary | ICD-10-CM | POA: Diagnosis not present

## 2023-11-09 DIAGNOSIS — D6959 Other secondary thrombocytopenia: Secondary | ICD-10-CM | POA: Diagnosis present

## 2023-11-09 DIAGNOSIS — F1110 Opioid abuse, uncomplicated: Secondary | ICD-10-CM | POA: Diagnosis present

## 2023-11-09 DIAGNOSIS — I739 Peripheral vascular disease, unspecified: Secondary | ICD-10-CM | POA: Diagnosis not present

## 2023-11-09 DIAGNOSIS — M7989 Other specified soft tissue disorders: Secondary | ICD-10-CM | POA: Diagnosis not present

## 2023-11-09 DIAGNOSIS — R1310 Dysphagia, unspecified: Secondary | ICD-10-CM | POA: Diagnosis present

## 2023-11-09 DIAGNOSIS — G9340 Encephalopathy, unspecified: Secondary | ICD-10-CM | POA: Diagnosis not present

## 2023-11-09 DIAGNOSIS — I82612 Acute embolism and thrombosis of superficial veins of left upper extremity: Secondary | ICD-10-CM | POA: Diagnosis present

## 2023-11-09 DIAGNOSIS — R4182 Altered mental status, unspecified: Secondary | ICD-10-CM | POA: Diagnosis present

## 2023-11-09 DIAGNOSIS — G9341 Metabolic encephalopathy: Secondary | ICD-10-CM | POA: Diagnosis not present

## 2023-11-09 DIAGNOSIS — R609 Edema, unspecified: Secondary | ICD-10-CM | POA: Diagnosis not present

## 2023-11-09 DIAGNOSIS — I82622 Acute embolism and thrombosis of deep veins of left upper extremity: Secondary | ICD-10-CM | POA: Diagnosis present

## 2023-11-09 DIAGNOSIS — F10931 Alcohol use, unspecified with withdrawal delirium: Secondary | ICD-10-CM | POA: Diagnosis not present

## 2023-11-09 DIAGNOSIS — I6389 Other cerebral infarction: Secondary | ICD-10-CM | POA: Diagnosis present

## 2023-11-09 DIAGNOSIS — R451 Restlessness and agitation: Secondary | ICD-10-CM

## 2023-11-09 DIAGNOSIS — Z91148 Patient's other noncompliance with medication regimen for other reason: Secondary | ICD-10-CM

## 2023-11-09 DIAGNOSIS — Z79899 Other long term (current) drug therapy: Secondary | ICD-10-CM | POA: Diagnosis not present

## 2023-11-09 DIAGNOSIS — I639 Cerebral infarction, unspecified: Secondary | ICD-10-CM | POA: Diagnosis not present

## 2023-11-09 DIAGNOSIS — I808 Phlebitis and thrombophlebitis of other sites: Secondary | ICD-10-CM | POA: Diagnosis not present

## 2023-11-09 HISTORY — DX: Alcohol use, unspecified, uncomplicated: F10.90

## 2023-11-09 HISTORY — DX: Other psychoactive substance use, unspecified, uncomplicated: F19.90

## 2023-11-09 HISTORY — DX: Epilepsy, unspecified, not intractable, without status epilepticus: G40.909

## 2023-11-09 LAB — CBC WITH DIFFERENTIAL/PLATELET
Abs Immature Granulocytes: 0 10*3/uL (ref 0.00–0.07)
Basophils Absolute: 0 10*3/uL (ref 0.0–0.1)
Basophils Relative: 0 %
Eosinophils Absolute: 0.1 10*3/uL (ref 0.0–0.5)
Eosinophils Relative: 1 %
HCT: 49.4 % (ref 39.0–52.0)
Hemoglobin: 16 g/dL (ref 13.0–17.0)
Lymphocytes Relative: 4 %
Lymphs Abs: 0.6 10*3/uL — ABNORMAL LOW (ref 0.7–4.0)
MCH: 29.9 pg (ref 26.0–34.0)
MCHC: 32.4 g/dL (ref 30.0–36.0)
MCV: 92.3 fL (ref 80.0–100.0)
Monocytes Absolute: 0.6 10*3/uL (ref 0.1–1.0)
Monocytes Relative: 4 %
Neutro Abs: 12.7 10*3/uL — ABNORMAL HIGH (ref 1.7–7.7)
Neutrophils Relative %: 91 %
Platelets: 110 10*3/uL — ABNORMAL LOW (ref 150–400)
RBC: 5.35 MIL/uL (ref 4.22–5.81)
RDW: 13.9 % (ref 11.5–15.5)
WBC: 14 10*3/uL — ABNORMAL HIGH (ref 4.0–10.5)
nRBC: 0 % (ref 0.0–0.2)
nRBC: 0 /100{WBCs}

## 2023-11-09 LAB — URINALYSIS, ROUTINE W REFLEX MICROSCOPIC
Glucose, UA: 50 mg/dL — AB
Ketones, ur: 5 mg/dL — AB
Protein, ur: 30 mg/dL — AB
Specific Gravity, Urine: 1.012 (ref 1.005–1.030)
pH: 6 (ref 5.0–8.0)

## 2023-11-09 LAB — COMPREHENSIVE METABOLIC PANEL WITH GFR
ALT: 20 U/L (ref 0–44)
AST: 27 U/L (ref 15–41)
Albumin: 4 g/dL (ref 3.5–5.0)
Alkaline Phosphatase: 106 U/L (ref 38–126)
Anion gap: 14 (ref 5–15)
BUN: 7 mg/dL (ref 6–20)
CO2: 21 mmol/L — ABNORMAL LOW (ref 22–32)
Calcium: 9 mg/dL (ref 8.9–10.3)
Chloride: 105 mmol/L (ref 98–111)
Creatinine, Ser: 1.08 mg/dL (ref 0.61–1.24)
GFR, Estimated: >60 mL/min (ref >60)
Glucose, Bld: 132 mg/dL — ABNORMAL HIGH (ref 70–99)
Potassium: 3.7 mmol/L (ref 3.5–5.1)
Sodium: 140 mmol/L (ref 135–145)
Total Bilirubin: 0.8 mg/dL (ref 0.0–1.2)
Total Protein: 7 g/dL (ref 6.5–8.1)

## 2023-11-09 LAB — ETHANOL: Alcohol, Ethyl (B): <15 mg/dL (ref <15)

## 2023-11-09 MED ORDER — DIAZEPAM 5 MG/ML IJ SOLN
5.0000 mg | Freq: Once | INTRAMUSCULAR | Status: AC
  Administered 2023-11-09: 5 mg via INTRAVENOUS
  Filled 2023-11-09: qty 2

## 2023-11-09 MED ORDER — SODIUM CHLORIDE 0.9 % IV SOLN
200.0000 mg | Freq: Once | INTRAVENOUS | Status: AC
  Administered 2023-11-09: 200 mg via INTRAVENOUS
  Filled 2023-11-09: qty 20

## 2023-11-09 MED ORDER — ADULT MULTIVITAMIN W/MINERALS CH
1.0000 | ORAL_TABLET | Freq: Every day | ORAL | Status: DC
  Administered 2023-11-12 – 2023-11-25 (×11): 1 via ORAL
  Filled 2023-11-09 (×12): qty 1

## 2023-11-09 MED ORDER — LORAZEPAM 1 MG PO TABS: 1.0000 mg | ORAL_TABLET | ORAL | Status: AC | PRN

## 2023-11-09 MED ORDER — LACTATED RINGERS IV BOLUS
1000.0000 mL | Freq: Once | INTRAVENOUS | Status: AC
  Administered 2023-11-09: 1000 mL via INTRAVENOUS

## 2023-11-09 MED ORDER — LEVETIRACETAM (KEPPRA) 500 MG/5 ML ADULT IV PUSH
2000.0000 mg | Freq: Once | INTRAVENOUS | Status: AC
  Administered 2023-11-09: 2000 mg via INTRAVENOUS
  Filled 2023-11-09: qty 20

## 2023-11-09 MED ORDER — THIAMINE MONONITRATE 100 MG PO TABS
100.0000 mg | ORAL_TABLET | Freq: Every day | ORAL | Status: DC
Start: 2023-11-09 — End: 2023-11-11

## 2023-11-09 MED ORDER — LORAZEPAM 2 MG/ML IJ SOLN: 2.0000 mg | Freq: Once | INTRAMUSCULAR | Status: AC

## 2023-11-09 MED ORDER — LORAZEPAM 2 MG/ML IJ SOLN
INTRAMUSCULAR | Status: AC
  Administered 2023-11-09: 2 mg via INTRAVENOUS
  Filled 2023-11-09: qty 1

## 2023-11-09 MED ORDER — HALOPERIDOL LACTATE 5 MG/ML IJ SOLN
2.5000 mg | Freq: Once | INTRAMUSCULAR | Status: AC
  Administered 2023-11-09: 2.5 mg via INTRAVENOUS
  Filled 2023-11-09: qty 1

## 2023-11-09 MED ORDER — DIAZEPAM 5 MG/ML IJ SOLN
5.0000 mg | Freq: Once | INTRAMUSCULAR | Status: AC
  Administered 2023-11-09: 5 mg via INTRAVENOUS

## 2023-11-09 MED ORDER — LORAZEPAM 2 MG/ML IJ SOLN
1.0000 mg | INTRAMUSCULAR | Status: AC | PRN
  Administered 2023-11-09: 2 mg via INTRAVENOUS
  Administered 2023-11-09: 4 mg via INTRAVENOUS
  Administered 2023-11-10 (×4): 3 mg via INTRAVENOUS
  Administered 2023-11-10: 2 mg via INTRAVENOUS
  Administered 2023-11-11 (×4): 3 mg via INTRAVENOUS
  Administered 2023-11-12: 2 mg via INTRAVENOUS
  Administered 2023-11-12 (×3): 3 mg via INTRAVENOUS
  Filled 2023-11-09 (×3): qty 2
  Filled 2023-11-09: qty 1
  Filled 2023-11-09 (×3): qty 2
  Filled 2023-11-09: qty 1
  Filled 2023-11-09 (×7): qty 2
  Filled 2023-11-09: qty 1

## 2023-11-09 MED ORDER — FOLIC ACID 1 MG PO TABS: 1.0000 mg | ORAL_TABLET | Freq: Every day | ORAL | Status: DC

## 2023-11-09 MED ADMIN — Thiamine HCl Inj 100 MG/ML: 100 mg | INTRAVENOUS | NDC 63323001302

## 2023-11-09 MED FILL — Thiamine HCl Inj 100 MG/ML: 100.0000 mg | INTRAMUSCULAR | Qty: 2 | Status: AC

## 2023-11-10 ENCOUNTER — Inpatient Hospital Stay (HOSPITAL_COMMUNITY): Payer: MEDICAID

## 2023-11-10 ENCOUNTER — Encounter (HOSPITAL_COMMUNITY): Payer: Self-pay | Admitting: Student

## 2023-11-10 DIAGNOSIS — G9341 Metabolic encephalopathy: Secondary | ICD-10-CM

## 2023-11-10 DIAGNOSIS — I160 Hypertensive urgency: Secondary | ICD-10-CM

## 2023-11-10 LAB — BASIC METABOLIC PANEL WITH GFR
Anion gap: 12 (ref 5–15)
BUN: 8 mg/dL (ref 6–20)
CO2: 19 mmol/L — ABNORMAL LOW (ref 22–32)
Calcium: 8.8 mg/dL — ABNORMAL LOW (ref 8.9–10.3)
Chloride: 107 mmol/L (ref 98–111)
Creatinine, Ser: 0.76 mg/dL (ref 0.61–1.24)
GFR, Estimated: >60 mL/min (ref >60)
Glucose, Bld: 79 mg/dL (ref 70–99)
Potassium: 4 mmol/L (ref 3.5–5.1)
Sodium: 138 mmol/L (ref 135–145)

## 2023-11-10 LAB — PHOSPHORUS: Phosphorus: 2.7 mg/dL (ref 2.5–4.6)

## 2023-11-10 LAB — CK: Total CK: 286 U/L (ref 49–397)

## 2023-11-10 LAB — MAGNESIUM: Magnesium: 2.1 mg/dL (ref 1.7–2.4)

## 2023-11-10 MED ORDER — ENOXAPARIN SODIUM 40 MG/0.4ML IJ SOSY
40.0000 mg | PREFILLED_SYRINGE | Freq: Every day | INTRAMUSCULAR | Status: DC
  Administered 2023-11-10 – 2023-11-21 (×12): 40 mg via SUBCUTANEOUS
  Filled 2023-11-10 (×16): qty 0.4

## 2023-11-10 MED ORDER — LEVETIRACETAM (KEPPRA) 500 MG/5 ML ADULT IV PUSH
500.0000 mg | Freq: Two times a day (BID) | INTRAVENOUS | Status: DC
  Administered 2023-11-10 – 2023-11-18 (×17): 500 mg via INTRAVENOUS
  Filled 2023-11-10 (×17): qty 5

## 2023-11-10 MED ORDER — PHENOBARBITAL SODIUM 130 MG/ML IJ SOLN
130.0000 mg | Freq: Once | INTRAMUSCULAR | Status: AC
  Administered 2023-11-10: 130 mg via INTRAVENOUS
  Filled 2023-11-10: qty 1

## 2023-11-10 MED ORDER — ONDANSETRON HCL 4 MG/2ML IJ SOLN: 4.0000 mg | Freq: Four times a day (QID) | INTRAMUSCULAR | Status: DC | PRN

## 2023-11-10 MED ORDER — ACETAMINOPHEN 650 MG RE SUPP
650.0000 mg | Freq: Four times a day (QID) | RECTAL | Status: DC | PRN
  Administered 2023-11-16 – 2023-11-17 (×2): 650 mg via RECTAL
  Filled 2023-11-10 (×3): qty 1

## 2023-11-10 MED ORDER — FOLIC ACID 5 MG/ML IJ SOLN
1.0000 mg | Freq: Every day | INTRAMUSCULAR | Status: DC
  Administered 2023-11-10 – 2023-11-21 (×12): 1 mg via INTRAVENOUS
  Filled 2023-11-10 (×15): qty 0.2

## 2023-11-10 MED ORDER — SENNOSIDES-DOCUSATE SODIUM 8.6-50 MG PO TABS: 1.0000 | ORAL_TABLET | Freq: Every evening | ORAL | Status: DC | PRN

## 2023-11-10 MED ORDER — ONDANSETRON HCL 4 MG PO TABS: 4.0000 mg | ORAL_TABLET | Freq: Four times a day (QID) | ORAL | Status: DC | PRN

## 2023-11-10 MED ORDER — HYDRALAZINE HCL 20 MG/ML IJ SOLN
10.0000 mg | Freq: Three times a day (TID) | INTRAMUSCULAR | Status: DC | PRN
  Administered 2023-11-10 – 2023-11-12 (×3): 10 mg via INTRAVENOUS
  Filled 2023-11-10 (×3): qty 1

## 2023-11-10 MED ORDER — HYDROXYZINE HCL 25 MG PO TABS: 25.0000 mg | ORAL_TABLET | Freq: Three times a day (TID) | ORAL | Status: DC | PRN

## 2023-11-10 MED ORDER — PHENOBARBITAL SODIUM 65 MG/ML IJ SOLN
65.0000 mg | Freq: Three times a day (TID) | INTRAMUSCULAR | Status: AC
  Administered 2023-11-11 – 2023-11-12 (×6): 65 mg via INTRAVENOUS
  Filled 2023-11-10 (×7): qty 1

## 2023-11-10 MED ORDER — SODIUM CHLORIDE 0.9 % IV SOLN
1.0000 mg | Freq: Every day | INTRAVENOUS | Status: DC
  Filled 2023-11-10: qty 0.2

## 2023-11-10 MED ORDER — ACETAMINOPHEN 325 MG PO TABS
650.0000 mg | ORAL_TABLET | Freq: Four times a day (QID) | ORAL | Status: DC | PRN
  Administered 2023-11-17 – 2023-11-24 (×7): 650 mg via ORAL
  Filled 2023-11-10 (×8): qty 2

## 2023-11-10 MED ORDER — PHENOBARBITAL SODIUM 65 MG/ML IJ SOLN
32.5000 mg | Freq: Three times a day (TID) | INTRAMUSCULAR | Status: DC
  Administered 2023-11-13: 32.5 mg via INTRAVENOUS
  Filled 2023-11-10: qty 1

## 2023-11-10 MED ORDER — SODIUM CHLORIDE 0.9 % IV SOLN: INTRAVENOUS | Status: DC

## 2023-11-10 MED ADMIN — Thiamine HCl Inj 100 MG/ML: 100 mg | INTRAVENOUS | NDC 43598005011

## 2023-11-10 NOTE — Procedures (Addendum)
 Patient Name: [name]  MRN: [id number]  Epilepsy Attending: Arlin MALVA Krebs  Referring Physician/Provider: Matthews Elida HERO, MD  Duration: 11/09/2023 2037 to 11/10/2023 2037  Patient history:  53 y.o. male with hx of EtOh use, seizures and non compliant with AEDs who is brought in by EMS after multiple seizures at home. EEG to evaluate for seizure  Level of alertness: Awake, asleep  AEDs during EEG study: LEV, Ativan  Technical aspects: This EEG study was done with scalp electrodes positioned according to the 10-20 International system of electrode placement. Electrical activity was reviewed with band pass filter of 1-70Hz , sensitivity of 7 uV/mm, display speed of 55mm/sec with a 60Hz  notched filter applied as appropriate. EEG data were recorded continuously and digitally stored.  Video monitoring was available and reviewed as appropriate.  Description: The posterior dominant rhythm consists of 7.5 Hz activity of moderate voltage (25-35 uV) seen predominantly in posterior head regions, symmetric and reactive to eye opening and eye closing. Sleep was characterized by vertex waves, sleep spindles (12 to 14 Hz), maximal frontocentral region. EEG showed continuous generalized 5 to 6 Hz theta slowing admixed with 15 to 18 Hz beta activity distributed symmetrically and diffusely. Hyperventilation and photic stimulation were not performed.     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 No seizures or epileptiform discharges were seen throughout the recording.  Caedyn Tassinari O Raelea Gosse

## 2023-11-11 ENCOUNTER — Inpatient Hospital Stay (HOSPITAL_COMMUNITY): Payer: MEDICAID

## 2023-11-11 LAB — MAGNESIUM: Magnesium: 2.1 mg/dL (ref 1.7–2.4)

## 2023-11-11 LAB — BASIC METABOLIC PANEL WITH GFR
Anion gap: 8 (ref 5–15)
BUN: 11 mg/dL (ref 6–20)
CO2: 23 mmol/L (ref 22–32)
Calcium: 9 mg/dL (ref 8.9–10.3)
Chloride: 107 mmol/L (ref 98–111)
Creatinine, Ser: 0.93 mg/dL (ref 0.61–1.24)
GFR, Estimated: >60 mL/min (ref >60)
Glucose, Bld: 83 mg/dL (ref 70–99)
Potassium: 3.4 mmol/L — ABNORMAL LOW (ref 3.5–5.1)
Sodium: 138 mmol/L (ref 135–145)

## 2023-11-11 LAB — CBC
HCT: 48.2 % (ref 39.0–52.0)
Hemoglobin: 16 g/dL (ref 13.0–17.0)
MCH: 30.2 pg (ref 26.0–34.0)
MCHC: 33.2 g/dL (ref 30.0–36.0)
MCV: 91.1 fL (ref 80.0–100.0)
Platelets: 97 10*3/uL — ABNORMAL LOW (ref 150–400)
RBC: 5.29 MIL/uL (ref 4.22–5.81)
RDW: 14.2 % (ref 11.5–15.5)
WBC: 9.5 10*3/uL (ref 4.0–10.5)
nRBC: 0 % (ref 0.0–0.2)

## 2023-11-11 LAB — PHOSPHORUS: Phosphorus: 3.3 mg/dL (ref 2.5–4.6)

## 2023-11-11 MED ORDER — PANTOPRAZOLE SODIUM 40 MG IV SOLR
40.0000 mg | Freq: Every day | INTRAVENOUS | Status: DC
  Administered 2023-11-11 – 2023-11-21 (×11): 40 mg via INTRAVENOUS
  Filled 2023-11-11 (×11): qty 10

## 2023-11-11 MED ORDER — POTASSIUM CHLORIDE 2 MEQ/ML IV SOLN
INTRAVENOUS | Status: DC
  Filled 2023-11-11 (×2): qty 1000

## 2023-11-11 MED ORDER — CHLORDIAZEPOXIDE HCL 5 MG PO CAPS
15.0000 mg | ORAL_CAPSULE | Freq: Three times a day (TID) | ORAL | Status: DC
  Administered 2023-11-12: 15 mg via ORAL
  Filled 2023-11-11 (×4): qty 3

## 2023-11-11 MED ADMIN — Thiamine HCl Inj 100 MG/ML: 100 mg | INTRAVENOUS | NDC 63323001301

## 2023-11-12 ENCOUNTER — Inpatient Hospital Stay (HOSPITAL_COMMUNITY): Payer: MEDICAID

## 2023-11-12 DIAGNOSIS — G9340 Encephalopathy, unspecified: Secondary | ICD-10-CM

## 2023-11-12 DIAGNOSIS — F10939 Alcohol use, unspecified with withdrawal, unspecified: Secondary | ICD-10-CM

## 2023-11-12 DIAGNOSIS — Z91148 Patient's other noncompliance with medication regimen for other reason: Secondary | ICD-10-CM

## 2023-11-12 LAB — COMPREHENSIVE METABOLIC PANEL WITH GFR
ALT: 19 U/L (ref 0–44)
AST: 21 U/L (ref 15–41)
Albumin: 3.2 g/dL — ABNORMAL LOW (ref 3.5–5.0)
Alkaline Phosphatase: 88 U/L (ref 38–126)
Anion gap: 10 (ref 5–15)
BUN: 11 mg/dL (ref 6–20)
CO2: 22 mmol/L (ref 22–32)
Calcium: 9.2 mg/dL (ref 8.9–10.3)
Chloride: 105 mmol/L (ref 98–111)
Creatinine, Ser: 0.78 mg/dL (ref 0.61–1.24)
GFR, Estimated: >60 mL/min (ref >60)
Glucose, Bld: 81 mg/dL (ref 70–99)
Potassium: 4 mmol/L (ref 3.5–5.1)
Sodium: 137 mmol/L (ref 135–145)
Total Bilirubin: 1.9 mg/dL — ABNORMAL HIGH (ref 0.0–1.2)
Total Protein: 6 g/dL — ABNORMAL LOW (ref 6.5–8.1)

## 2023-11-12 LAB — CBC WITH DIFFERENTIAL/PLATELET
Abs Immature Granulocytes: 0.03 10*3/uL (ref 0.00–0.07)
Basophils Absolute: 0.1 10*3/uL (ref 0.0–0.1)
Basophils Relative: 1 %
Eosinophils Absolute: 0.3 10*3/uL (ref 0.0–0.5)
Eosinophils Relative: 3 %
HCT: 46 % (ref 39.0–52.0)
Hemoglobin: 15.2 g/dL (ref 13.0–17.0)
Immature Granulocytes: 0 %
Lymphocytes Relative: 16 %
Lymphs Abs: 1.5 10*3/uL (ref 0.7–4.0)
MCH: 30 pg (ref 26.0–34.0)
MCHC: 33 g/dL (ref 30.0–36.0)
MCV: 90.9 fL (ref 80.0–100.0)
Monocytes Absolute: 0.8 10*3/uL (ref 0.1–1.0)
Monocytes Relative: 8 %
Neutro Abs: 6.9 10*3/uL (ref 1.7–7.7)
Neutrophils Relative %: 72 %
Platelets: 92 10*3/uL — ABNORMAL LOW (ref 150–400)
RBC: 5.06 MIL/uL (ref 4.22–5.81)
RDW: 13.7 % (ref 11.5–15.5)
WBC: 9.5 10*3/uL (ref 4.0–10.5)
nRBC: 0 % (ref 0.0–0.2)

## 2023-11-12 LAB — PHOSPHORUS: Phosphorus: 3.1 mg/dL (ref 2.5–4.6)

## 2023-11-12 LAB — MAGNESIUM: Magnesium: 1.9 mg/dL (ref 1.7–2.4)

## 2023-11-12 LAB — BRAIN NATRIURETIC PEPTIDE: B Natriuretic Peptide: 62.1 pg/mL (ref 0.0–100.0)

## 2023-11-12 MED ORDER — MIDAZOLAM HCL 2 MG/2ML IJ SOLN: 2.0000 mg | Freq: Once | INTRAMUSCULAR | Status: DC

## 2023-11-12 MED ORDER — LORAZEPAM 2 MG/ML IJ SOLN
1.0000 mg | INTRAMUSCULAR | Status: AC | PRN
  Administered 2023-11-12: 3 mg via INTRAVENOUS
  Administered 2023-11-13 (×4): 4 mg via INTRAVENOUS
  Administered 2023-11-13: 2 mg via INTRAVENOUS
  Administered 2023-11-13 – 2023-11-14 (×3): 4 mg via INTRAVENOUS
  Administered 2023-11-14: 2 mg via INTRAVENOUS
  Administered 2023-11-14: 3 mg via INTRAVENOUS
  Administered 2023-11-15 (×2): 2 mg via INTRAVENOUS
  Administered 2023-11-15 – 2023-11-16 (×2): 4 mg via INTRAVENOUS
  Filled 2023-11-12 (×2): qty 1
  Filled 2023-11-12 (×7): qty 2
  Filled 2023-11-12: qty 1
  Filled 2023-11-12 (×4): qty 2
  Filled 2023-11-12: qty 1
  Filled 2023-11-12: qty 2

## 2023-11-12 MED ORDER — KCL-LACTATED RINGERS-D5W 20 MEQ/L IV SOLN
INTRAVENOUS | Status: DC
  Filled 2023-11-12 (×2): qty 1000

## 2023-11-12 MED ORDER — LORAZEPAM 1 MG PO TABS
1.0000 mg | ORAL_TABLET | ORAL | Status: AC | PRN
  Administered 2023-11-13: 3 mg via ORAL
  Filled 2023-11-12: qty 3

## 2023-11-12 MED ORDER — CHLORDIAZEPOXIDE HCL 5 MG PO CAPS
15.0000 mg | ORAL_CAPSULE | Freq: Three times a day (TID) | ORAL | Status: DC
  Administered 2023-11-12: 15 mg via ORAL
  Filled 2023-11-12 (×2): qty 3

## 2023-11-12 MED ADMIN — Thiamine HCl Inj 100 MG/ML: 100 mg | INTRAVENOUS | NDC 63323001301

## 2023-11-13 ENCOUNTER — Other Ambulatory Visit (HOSPITAL_COMMUNITY): Payer: Self-pay

## 2023-11-13 LAB — COMPREHENSIVE METABOLIC PANEL WITH GFR
ALT: 20 U/L (ref 0–44)
AST: 27 U/L (ref 15–41)
Albumin: 3.9 g/dL (ref 3.5–5.0)
Alkaline Phosphatase: 87 U/L (ref 38–126)
Anion gap: 15 (ref 5–15)
BUN: 11 mg/dL (ref 6–20)
CO2: 19 mmol/L — ABNORMAL LOW (ref 22–32)
Calcium: 9.5 mg/dL (ref 8.9–10.3)
Chloride: 104 mmol/L (ref 98–111)
Creatinine, Ser: 0.83 mg/dL (ref 0.61–1.24)
GFR, Estimated: >60 mL/min (ref >60)
Glucose, Bld: 85 mg/dL (ref 70–99)
Potassium: 3.9 mmol/L (ref 3.5–5.1)
Sodium: 138 mmol/L (ref 135–145)
Total Bilirubin: 1.9 mg/dL — ABNORMAL HIGH (ref 0.0–1.2)
Total Protein: 6.6 g/dL (ref 6.5–8.1)

## 2023-11-13 LAB — CBC WITH DIFFERENTIAL/PLATELET
Abs Immature Granulocytes: 0.03 10*3/uL (ref 0.00–0.07)
Basophils Absolute: 0.1 10*3/uL (ref 0.0–0.1)
Basophils Relative: 1 %
Eosinophils Absolute: 0.3 10*3/uL (ref 0.0–0.5)
Eosinophils Relative: 4 %
HCT: 46.1 % (ref 39.0–52.0)
Hemoglobin: 15.5 g/dL (ref 13.0–17.0)
Immature Granulocytes: 0 %
Lymphocytes Relative: 15 %
Lymphs Abs: 1.1 10*3/uL (ref 0.7–4.0)
MCH: 30.2 pg (ref 26.0–34.0)
MCHC: 33.6 g/dL (ref 30.0–36.0)
MCV: 89.7 fL (ref 80.0–100.0)
Monocytes Absolute: 0.7 10*3/uL (ref 0.1–1.0)
Monocytes Relative: 9 %
Neutro Abs: 5.3 10*3/uL (ref 1.7–7.7)
Neutrophils Relative %: 71 %
Platelets: 84 10*3/uL — ABNORMAL LOW (ref 150–400)
RBC: 5.14 MIL/uL (ref 4.22–5.81)
RDW: 13.4 % (ref 11.5–15.5)
WBC: 7.5 10*3/uL (ref 4.0–10.5)
nRBC: 0 % (ref 0.0–0.2)

## 2023-11-13 LAB — LIPID PANEL
Cholesterol: 137 mg/dL (ref 0–200)
HDL: 47 mg/dL (ref >40)
LDL Cholesterol: 75 mg/dL (ref 0–99)
Total CHOL/HDL Ratio: 2.9 ratio
Triglycerides: 73 mg/dL (ref <150)
VLDL: 15 mg/dL (ref 0–40)

## 2023-11-13 LAB — PHOSPHORUS: Phosphorus: 2.8 mg/dL (ref 2.5–4.6)

## 2023-11-13 LAB — HEMOGLOBIN A1C
Hgb A1c MFr Bld: 4.9 % (ref 4.8–5.6)
Mean Plasma Glucose: 93.93 mg/dL

## 2023-11-13 LAB — BRAIN NATRIURETIC PEPTIDE: B Natriuretic Peptide: 49.4 pg/mL (ref 0.0–100.0)

## 2023-11-13 LAB — MAGNESIUM: Magnesium: 1.9 mg/dL (ref 1.7–2.4)

## 2023-11-13 MED ORDER — CHLORDIAZEPOXIDE HCL 5 MG PO CAPS
20.0000 mg | ORAL_CAPSULE | Freq: Three times a day (TID) | ORAL | Status: DC
  Administered 2023-11-13 (×3): 20 mg via ORAL
  Filled 2023-11-13 (×3): qty 4

## 2023-11-13 MED ORDER — LORAZEPAM 2 MG/ML IJ SOLN
2.0000 mg | Freq: Once | INTRAMUSCULAR | Status: AC
  Administered 2023-11-13: 2 mg via INTRAVENOUS
  Filled 2023-11-13: qty 1

## 2023-11-13 MED ORDER — ASPIRIN 300 MG RE SUPP
150.0000 mg | Freq: Every day | RECTAL | Status: DC
  Administered 2023-11-13: 150 mg via RECTAL
  Filled 2023-11-13 (×2): qty 1

## 2023-11-13 MED ORDER — PHENOBARBITAL SODIUM 130 MG/ML IJ SOLN
130.0000 mg | Freq: Three times a day (TID) | INTRAMUSCULAR | Status: DC
  Administered 2023-11-13 – 2023-11-15 (×8): 130 mg via INTRAVENOUS
  Filled 2023-11-13 (×8): qty 1

## 2023-11-13 MED ORDER — METOPROLOL TARTRATE 50 MG PO TABS
50.0000 mg | ORAL_TABLET | Freq: Two times a day (BID) | ORAL | Status: DC
  Administered 2023-11-13 – 2023-11-14 (×4): 50 mg via ORAL
  Filled 2023-11-13 (×4): qty 1

## 2023-11-13 MED ORDER — KCL-LACTATED RINGERS-D5W 20 MEQ/L IV SOLN
INTRAVENOUS | Status: DC
  Filled 2023-11-13 (×2): qty 1000

## 2023-11-13 MED ADMIN — Thiamine HCl Inj 100 MG/ML: 100 mg | INTRAVENOUS | NDC 63323001301

## 2023-11-14 ENCOUNTER — Inpatient Hospital Stay (HOSPITAL_COMMUNITY): Payer: MEDICAID

## 2023-11-14 DIAGNOSIS — I5031 Acute diastolic (congestive) heart failure: Secondary | ICD-10-CM

## 2023-11-14 LAB — CBC WITH DIFFERENTIAL/PLATELET
Abs Immature Granulocytes: 0.03 10*3/uL (ref 0.00–0.07)
Basophils Absolute: 0.1 10*3/uL (ref 0.0–0.1)
Basophils Relative: 2 %
Eosinophils Absolute: 0.3 10*3/uL (ref 0.0–0.5)
Eosinophils Relative: 5 %
HCT: 49.7 % (ref 39.0–52.0)
Hemoglobin: 15.7 g/dL (ref 13.0–17.0)
Immature Granulocytes: 0 %
Lymphocytes Relative: 21 %
Lymphs Abs: 1.5 10*3/uL (ref 0.7–4.0)
MCH: 30 pg (ref 26.0–34.0)
MCHC: 31.6 g/dL (ref 30.0–36.0)
MCV: 95 fL (ref 80.0–100.0)
Monocytes Absolute: 0.7 10*3/uL (ref 0.1–1.0)
Monocytes Relative: 10 %
Neutro Abs: 4.3 10*3/uL (ref 1.7–7.7)
Neutrophils Relative %: 62 %
Platelets: 79 10*3/uL — ABNORMAL LOW (ref 150–400)
RBC: 5.23 MIL/uL (ref 4.22–5.81)
RDW: 13.4 % (ref 11.5–15.5)
WBC: 7 10*3/uL (ref 4.0–10.5)
nRBC: 0 % (ref 0.0–0.2)

## 2023-11-14 LAB — BRAIN NATRIURETIC PEPTIDE: B Natriuretic Peptide: 149.4 pg/mL — ABNORMAL HIGH (ref 0.0–100.0)

## 2023-11-14 LAB — COMPREHENSIVE METABOLIC PANEL WITH GFR
ALT: 20 U/L (ref 0–44)
AST: 24 U/L (ref 15–41)
Albumin: 3.5 g/dL (ref 3.5–5.0)
Alkaline Phosphatase: 78 U/L (ref 38–126)
Anion gap: 8 (ref 5–15)
BUN: 6 mg/dL (ref 6–20)
CO2: 23 mmol/L (ref 22–32)
Calcium: 9.3 mg/dL (ref 8.9–10.3)
Chloride: 107 mmol/L (ref 98–111)
Creatinine, Ser: 0.87 mg/dL (ref 0.61–1.24)
GFR, Estimated: >60 mL/min (ref >60)
Glucose, Bld: 135 mg/dL — ABNORMAL HIGH (ref 70–99)
Potassium: 3.8 mmol/L (ref 3.5–5.1)
Sodium: 138 mmol/L (ref 135–145)
Total Bilirubin: 1 mg/dL (ref 0.0–1.2)
Total Protein: 6.4 g/dL — ABNORMAL LOW (ref 6.5–8.1)

## 2023-11-14 LAB — ECHOCARDIOGRAM COMPLETE
AR max vel: 2.58 cm2
AV Peak grad: 5.4 mmHg
Ao pk vel: 1.16 m/s
Area-P 1/2: 2.16 cm2
S' Lateral: 3.4 cm
Weight: 2400 [oz_av]

## 2023-11-14 LAB — MAGNESIUM: Magnesium: 1.8 mg/dL (ref 1.7–2.4)

## 2023-11-14 LAB — PHOSPHORUS: Phosphorus: 2.8 mg/dL (ref 2.5–4.6)

## 2023-11-14 MED ORDER — CHLORDIAZEPOXIDE HCL 5 MG PO CAPS
25.0000 mg | ORAL_CAPSULE | Freq: Three times a day (TID) | ORAL | Status: DC
  Administered 2023-11-14 – 2023-11-20 (×14): 25 mg via ORAL
  Filled 2023-11-14 (×16): qty 5

## 2023-11-14 MED ORDER — ASPIRIN 81 MG PO CHEW
81.0000 mg | CHEWABLE_TABLET | Freq: Every day | ORAL | Status: DC
  Administered 2023-11-14 – 2023-11-25 (×11): 81 mg via ORAL
  Filled 2023-11-14 (×11): qty 1

## 2023-11-14 MED ORDER — ROSUVASTATIN CALCIUM 5 MG PO TABS
10.0000 mg | ORAL_TABLET | Freq: Every day | ORAL | Status: DC
  Administered 2023-11-14 – 2023-11-25 (×10): 10 mg via ORAL
  Filled 2023-11-14 (×12): qty 2

## 2023-11-14 MED ORDER — KCL-LACTATED RINGERS-D5W 20 MEQ/L IV SOLN
INTRAVENOUS | Status: AC
  Filled 2023-11-14 (×2): qty 1000

## 2023-11-14 MED ADMIN — Thiamine HCl Inj 100 MG/ML: 100 mg | INTRAVENOUS | NDC 72485050701

## 2023-11-15 ENCOUNTER — Inpatient Hospital Stay (HOSPITAL_COMMUNITY): Payer: MEDICAID

## 2023-11-15 DIAGNOSIS — G40909 Epilepsy, unspecified, not intractable, without status epilepticus: Secondary | ICD-10-CM

## 2023-11-15 DIAGNOSIS — D696 Thrombocytopenia, unspecified: Secondary | ICD-10-CM

## 2023-11-15 DIAGNOSIS — I10 Essential (primary) hypertension: Secondary | ICD-10-CM

## 2023-11-15 DIAGNOSIS — I6389 Other cerebral infarction: Secondary | ICD-10-CM

## 2023-11-15 DIAGNOSIS — I739 Peripheral vascular disease, unspecified: Secondary | ICD-10-CM

## 2023-11-15 DIAGNOSIS — F1110 Opioid abuse, uncomplicated: Secondary | ICD-10-CM

## 2023-11-15 DIAGNOSIS — F1210 Cannabis abuse, uncomplicated: Secondary | ICD-10-CM

## 2023-11-15 DIAGNOSIS — E785 Hyperlipidemia, unspecified: Secondary | ICD-10-CM

## 2023-11-15 LAB — AMMONIA: Ammonia: 42 umol/L — ABNORMAL HIGH (ref 9–35)

## 2023-11-15 LAB — VITAMIN B12: Vitamin B-12: 310 pg/mL (ref 180–914)

## 2023-11-15 LAB — TSH: TSH: 1.376 u[IU]/mL (ref 0.350–4.500)

## 2023-11-15 MED ORDER — CLOPIDOGREL BISULFATE 75 MG PO TABS: 75.0000 mg | ORAL_TABLET | Freq: Every day | ORAL | Status: DC

## 2023-11-15 MED ORDER — KCL-LACTATED RINGERS-D5W 20 MEQ/L IV SOLN
INTRAVENOUS | Status: AC
  Filled 2023-11-15: qty 1000

## 2023-11-15 MED ORDER — METOPROLOL TARTRATE 100 MG PO TABS
100.0000 mg | ORAL_TABLET | Freq: Two times a day (BID) | ORAL | Status: DC
  Administered 2023-11-15 – 2023-11-20 (×5): 100 mg via ORAL
  Filled 2023-11-15 (×11): qty 1

## 2023-11-15 MED ORDER — QUETIAPINE FUMARATE 25 MG PO TABS
25.0000 mg | ORAL_TABLET | Freq: Two times a day (BID) | ORAL | Status: DC
  Administered 2023-11-15 – 2023-11-16 (×3): 25 mg via ORAL
  Filled 2023-11-15 (×3): qty 1

## 2023-11-15 MED ORDER — KCL-LACTATED RINGERS-D5W 20 MEQ/L IV SOLN
INTRAVENOUS | Status: DC
  Filled 2023-11-15: qty 1000

## 2023-11-15 MED ORDER — MAGNESIUM SULFATE IN D5W 1-5 GM/100ML-% IV SOLN
1.0000 g | Freq: Once | INTRAVENOUS | Status: AC
  Administered 2023-11-15: 1 g via INTRAVENOUS
  Filled 2023-11-15: qty 100

## 2023-11-15 MED ADMIN — Thiamine HCl Inj 100 MG/ML: 100 mg | INTRAVENOUS | NDC 63323001301

## 2023-11-15 MED ADMIN — Iohexol IV Soln 350 MG/ML: 75 mL | INTRAVENOUS | NDC 00407141490

## 2023-11-16 ENCOUNTER — Inpatient Hospital Stay (HOSPITAL_COMMUNITY): Payer: MEDICAID

## 2023-11-16 DIAGNOSIS — E7220 Disorder of urea cycle metabolism, unspecified: Secondary | ICD-10-CM

## 2023-11-16 LAB — COMPREHENSIVE METABOLIC PANEL WITH GFR
ALT: 30 U/L (ref 0–44)
AST: 30 U/L (ref 15–41)
Albumin: 3.5 g/dL (ref 3.5–5.0)
Alkaline Phosphatase: 102 U/L (ref 38–126)
Anion gap: 8 (ref 5–15)
BUN: <5 mg/dL — ABNORMAL LOW (ref 6–20)
CO2: 25 mmol/L (ref 22–32)
Calcium: 9.4 mg/dL (ref 8.9–10.3)
Chloride: 106 mmol/L (ref 98–111)
Creatinine, Ser: 0.89 mg/dL (ref 0.61–1.24)
GFR, Estimated: >60 mL/min
Glucose, Bld: 100 mg/dL — ABNORMAL HIGH (ref 70–99)
Potassium: 4.3 mmol/L (ref 3.5–5.1)
Sodium: 139 mmol/L (ref 135–145)
Total Bilirubin: 0.6 mg/dL (ref 0.0–1.2)
Total Protein: 6.5 g/dL (ref 6.5–8.1)

## 2023-11-16 LAB — CBC WITH DIFFERENTIAL/PLATELET
Abs Immature Granulocytes: 0.05 10*3/uL (ref 0.00–0.07)
Basophils Absolute: 0.1 10*3/uL (ref 0.0–0.1)
Basophils Relative: 1 %
Eosinophils Absolute: 0.3 10*3/uL (ref 0.0–0.5)
Eosinophils Relative: 4 %
HCT: 48.2 % (ref 39.0–52.0)
Hemoglobin: 16 g/dL (ref 13.0–17.0)
Immature Granulocytes: 1 %
Lymphocytes Relative: 11 %
Lymphs Abs: 1 10*3/uL (ref 0.7–4.0)
MCH: 30.2 pg (ref 26.0–34.0)
MCHC: 33.2 g/dL (ref 30.0–36.0)
MCV: 90.9 fL (ref 80.0–100.0)
Monocytes Absolute: 0.6 10*3/uL (ref 0.1–1.0)
Monocytes Relative: 7 %
Neutro Abs: 6.9 10*3/uL (ref 1.7–7.7)
Neutrophils Relative %: 76 %
Platelets: 97 10*3/uL — ABNORMAL LOW (ref 150–400)
RBC: 5.3 MIL/uL (ref 4.22–5.81)
RDW: 13.5 % (ref 11.5–15.5)
WBC: 9 10*3/uL (ref 4.0–10.5)
nRBC: 0 % (ref 0.0–0.2)

## 2023-11-16 LAB — MAGNESIUM: Magnesium: 2 mg/dL (ref 1.7–2.4)

## 2023-11-16 LAB — AMMONIA: Ammonia: 87 umol/L — ABNORMAL HIGH (ref 9–35)

## 2023-11-16 LAB — PHOSPHORUS: Phosphorus: 3.5 mg/dL (ref 2.5–4.6)

## 2023-11-16 MED ORDER — QUETIAPINE FUMARATE 50 MG PO TABS
50.0000 mg | ORAL_TABLET | Freq: Two times a day (BID) | ORAL | Status: DC
  Administered 2023-11-17 – 2023-11-20 (×6): 50 mg via ORAL
  Filled 2023-11-16 (×7): qty 1

## 2023-11-16 MED ORDER — SODIUM CHLORIDE 0.9 % IV SOLN
3.0000 g | Freq: Four times a day (QID) | INTRAVENOUS | Status: DC
  Administered 2023-11-16 – 2023-11-19 (×14): 3 g via INTRAVENOUS
  Filled 2023-11-16 (×14): qty 8

## 2023-11-16 MED ORDER — KCL-LACTATED RINGERS-D5W 20 MEQ/L IV SOLN
INTRAVENOUS | Status: DC
  Filled 2023-11-16 (×2): qty 1000

## 2023-11-16 MED ORDER — PHENOBARBITAL SODIUM 130 MG/ML IJ SOLN
130.0000 mg | Freq: Three times a day (TID) | INTRAMUSCULAR | Status: DC
  Administered 2023-11-16 (×4): 130 mg via INTRAVENOUS
  Filled 2023-11-16 (×4): qty 1

## 2023-11-16 MED ORDER — QUETIAPINE FUMARATE 25 MG PO TABS
25.0000 mg | ORAL_TABLET | Freq: Once | ORAL | Status: DC
  Filled 2023-11-16: qty 1

## 2023-11-16 MED ORDER — LORAZEPAM 2 MG/ML IJ SOLN: 1.0000 mg | Freq: Once | INTRAMUSCULAR | Status: DC

## 2023-11-16 MED ADMIN — Thiamine HCl Inj 100 MG/ML: 100 mg | INTRAVENOUS | NDC 63323001301

## 2023-11-17 ENCOUNTER — Inpatient Hospital Stay (HOSPITAL_COMMUNITY): Payer: MEDICAID

## 2023-11-17 DIAGNOSIS — R1310 Dysphagia, unspecified: Secondary | ICD-10-CM

## 2023-11-17 LAB — COMPREHENSIVE METABOLIC PANEL WITH GFR
ALT: 34 U/L (ref 0–44)
AST: 37 U/L (ref 15–41)
Albumin: 3 g/dL — ABNORMAL LOW (ref 3.5–5.0)
Alkaline Phosphatase: 72 U/L (ref 38–126)
Anion gap: 7 (ref 5–15)
BUN: 8 mg/dL (ref 6–20)
CO2: 22 mmol/L (ref 22–32)
Calcium: 8.6 mg/dL — ABNORMAL LOW (ref 8.9–10.3)
Chloride: 102 mmol/L (ref 98–111)
Creatinine, Ser: 1 mg/dL (ref 0.61–1.24)
GFR, Estimated: >60 mL/min (ref >60)
Glucose, Bld: 114 mg/dL — ABNORMAL HIGH (ref 70–99)
Potassium: 4.1 mmol/L (ref 3.5–5.1)
Sodium: 131 mmol/L — ABNORMAL LOW (ref 135–145)
Total Bilirubin: 0.7 mg/dL (ref 0.0–1.2)
Total Protein: 6.3 g/dL — ABNORMAL LOW (ref 6.5–8.1)

## 2023-11-17 LAB — CBC WITH DIFFERENTIAL/PLATELET
Abs Immature Granulocytes: 0.06 10*3/uL (ref 0.00–0.07)
Basophils Absolute: 0.1 10*3/uL (ref 0.0–0.1)
Basophils Relative: 1 %
Eosinophils Absolute: 0 10*3/uL (ref 0.0–0.5)
Eosinophils Relative: 0 %
HCT: 44.8 % (ref 39.0–52.0)
Hemoglobin: 15.2 g/dL (ref 13.0–17.0)
Immature Granulocytes: 1 %
Lymphocytes Relative: 6 %
Lymphs Abs: 0.6 10*3/uL — ABNORMAL LOW (ref 0.7–4.0)
MCH: 30.2 pg (ref 26.0–34.0)
MCHC: 33.9 g/dL (ref 30.0–36.0)
MCV: 88.9 fL (ref 80.0–100.0)
Monocytes Absolute: 0.7 10*3/uL (ref 0.1–1.0)
Monocytes Relative: 6 %
Neutro Abs: 9.3 10*3/uL — ABNORMAL HIGH (ref 1.7–7.7)
Neutrophils Relative %: 86 %
Platelets: 76 10*3/uL — ABNORMAL LOW (ref 150–400)
RBC: 5.04 MIL/uL (ref 4.22–5.81)
RDW: 13.4 % (ref 11.5–15.5)
WBC: 10.8 10*3/uL — ABNORMAL HIGH (ref 4.0–10.5)
nRBC: 0 % (ref 0.0–0.2)

## 2023-11-17 LAB — URINALYSIS, W/ REFLEX TO CULTURE (INFECTION SUSPECTED)
Protein, ur: 30 mg/dL — AB
Specific Gravity, Urine: 1.024 (ref 1.005–1.030)
pH: 8 (ref 5.0–8.0)

## 2023-11-17 LAB — AMMONIA: Ammonia: 47 umol/L — ABNORMAL HIGH (ref 9–35)

## 2023-11-17 LAB — MAGNESIUM: Magnesium: 1.8 mg/dL (ref 1.7–2.4)

## 2023-11-17 LAB — PHOSPHORUS: Phosphorus: 2.4 mg/dL — ABNORMAL LOW (ref 2.5–4.6)

## 2023-11-17 LAB — PROCALCITONIN: Procalcitonin: 0.42 ng/mL

## 2023-11-17 LAB — C-REACTIVE PROTEIN: CRP: 12.3 mg/dL — ABNORMAL HIGH (ref <1.0)

## 2023-11-17 MED ORDER — LORAZEPAM 2 MG/ML IJ SOLN: 1.0000 mg | INTRAMUSCULAR | Status: DC | PRN

## 2023-11-17 MED ORDER — KCL-LACTATED RINGERS-D5W 20 MEQ/L IV SOLN: INTRAVENOUS | Status: AC

## 2023-11-17 MED ORDER — CYANOCOBALAMIN 1000 MCG/ML IJ SOLN: 1000.0000 ug | Freq: Once | INTRAMUSCULAR | Status: DC

## 2023-11-17 MED ORDER — TAMSULOSIN HCL 0.4 MG PO CAPS
0.4000 mg | ORAL_CAPSULE | Freq: Every day | ORAL | Status: DC
  Administered 2023-11-18 – 2023-11-22 (×5): 0.4 mg via ORAL
  Filled 2023-11-17 (×8): qty 1

## 2023-11-17 MED ORDER — CYANOCOBALAMIN 1000 MCG/ML IJ SOLN
1000.0000 ug | Freq: Every day | INTRAMUSCULAR | Status: DC
  Administered 2023-11-17 – 2023-11-18 (×2): 1000 ug via INTRAMUSCULAR
  Filled 2023-11-17 (×2): qty 1

## 2023-11-17 MED ORDER — THIAMINE HCL 100 MG/ML IJ SOLN
100.0000 mg | INTRAMUSCULAR | Status: DC
  Administered 2023-11-19 – 2023-11-21 (×3): 100 mg via INTRAVENOUS
  Filled 2023-11-17 (×3): qty 2

## 2023-11-17 MED ORDER — CHLORHEXIDINE GLUCONATE CLOTH 2 % EX PADS
6.0000 | MEDICATED_PAD | Freq: Every day | CUTANEOUS | Status: DC
  Administered 2023-11-17 – 2023-11-25 (×8): 6 via TOPICAL

## 2023-11-17 MED ORDER — LACTULOSE 10 GM/15ML PO SOLN
30.0000 g | Freq: Two times a day (BID) | ORAL | Status: AC
  Filled 2023-11-17: qty 60

## 2023-11-17 MED ORDER — THIAMINE HCL 100 MG/ML IJ SOLN
500.0000 mg | Freq: Three times a day (TID) | INTRAVENOUS | Status: AC
  Administered 2023-11-17 – 2023-11-19 (×6): 500 mg via INTRAVENOUS
  Filled 2023-11-17 (×6): qty 5

## 2023-11-17 MED ORDER — ACETAMINOPHEN 650 MG RE SUPP
325.0000 mg | Freq: Once | RECTAL | Status: AC
  Administered 2023-11-17: 325 mg via RECTAL
  Filled 2023-11-17: qty 1

## 2023-11-17 MED ORDER — LORAZEPAM 2 MG/ML IJ SOLN
1.0000 mg | INTRAMUSCULAR | Status: DC | PRN
  Administered 2023-11-18 – 2023-11-21 (×5): 2 mg via INTRAVENOUS
  Filled 2023-11-17 (×6): qty 1

## 2023-11-17 MED ADMIN — Thiamine HCl Inj 100 MG/ML: 100 mg | INTRAVENOUS | NDC 72485050701

## 2023-11-18 DIAGNOSIS — F1090 Alcohol use, unspecified, uncomplicated: Secondary | ICD-10-CM

## 2023-11-18 DIAGNOSIS — R443 Hallucinations, unspecified: Secondary | ICD-10-CM

## 2023-11-18 LAB — PHOSPHORUS: Phosphorus: 3.2 mg/dL (ref 2.5–4.6)

## 2023-11-18 LAB — CBC WITH DIFFERENTIAL/PLATELET
Abs Immature Granulocytes: 0.01 10*3/uL (ref 0.00–0.07)
Basophils Absolute: 0 10*3/uL (ref 0.0–0.1)
Basophils Relative: 1 %
Eosinophils Absolute: 0.2 10*3/uL (ref 0.0–0.5)
Eosinophils Relative: 3 %
HCT: 40.8 % (ref 39.0–52.0)
Hemoglobin: 13.6 g/dL (ref 13.0–17.0)
Immature Granulocytes: 0 %
Lymphocytes Relative: 11 %
Lymphs Abs: 0.7 10*3/uL (ref 0.7–4.0)
MCH: 30 pg (ref 26.0–34.0)
MCHC: 33.3 g/dL (ref 30.0–36.0)
MCV: 89.9 fL (ref 80.0–100.0)
Monocytes Absolute: 0.7 10*3/uL (ref 0.1–1.0)
Monocytes Relative: 12 %
Neutro Abs: 4.7 10*3/uL (ref 1.7–7.7)
Neutrophils Relative %: 73 %
Platelets: 81 10*3/uL — ABNORMAL LOW (ref 150–400)
RBC: 4.54 MIL/uL (ref 4.22–5.81)
RDW: 13.6 % (ref 11.5–15.5)
WBC: 6.4 10*3/uL (ref 4.0–10.5)
nRBC: 0 % (ref 0.0–0.2)

## 2023-11-18 LAB — COMPREHENSIVE METABOLIC PANEL WITH GFR
ALT: 38 U/L (ref 0–44)
AST: 34 U/L (ref 15–41)
Albumin: 2.6 g/dL — ABNORMAL LOW (ref 3.5–5.0)
Alkaline Phosphatase: 73 U/L (ref 38–126)
Anion gap: 6 (ref 5–15)
BUN: 12 mg/dL (ref 6–20)
CO2: 23 mmol/L (ref 22–32)
Calcium: 8.6 mg/dL — ABNORMAL LOW (ref 8.9–10.3)
Chloride: 104 mmol/L (ref 98–111)
Creatinine, Ser: 0.97 mg/dL (ref 0.61–1.24)
GFR, Estimated: >60 mL/min (ref >60)
Glucose, Bld: 96 mg/dL (ref 70–99)
Potassium: 4.1 mmol/L (ref 3.5–5.1)
Sodium: 133 mmol/L — ABNORMAL LOW (ref 135–145)
Total Bilirubin: 0.3 mg/dL (ref 0.0–1.2)
Total Protein: 5.5 g/dL — ABNORMAL LOW (ref 6.5–8.1)

## 2023-11-18 LAB — C-REACTIVE PROTEIN: CRP: 13.5 mg/dL — ABNORMAL HIGH (ref <1.0)

## 2023-11-18 LAB — AMMONIA: Ammonia: 50 umol/L — ABNORMAL HIGH (ref 9–35)

## 2023-11-18 LAB — PROCALCITONIN: Procalcitonin: 0.25 ng/mL

## 2023-11-18 LAB — MAGNESIUM: Magnesium: 1.9 mg/dL (ref 1.7–2.4)

## 2023-11-18 LAB — VITAMIN D 25 HYDROXY (VIT D DEFICIENCY, FRACTURES): Vit D, 25-Hydroxy: 31.7 ng/mL (ref 30–100)

## 2023-11-18 MED ORDER — ENSURE PLUS HIGH PROTEIN PO LIQD
237.0000 mL | Freq: Two times a day (BID) | ORAL | Status: DC
  Administered 2023-11-19 – 2023-11-25 (×12): 237 mL via ORAL

## 2023-11-18 MED ORDER — CYANOCOBALAMIN 1000 MCG/ML IJ SOLN
1000.0000 ug | INTRAMUSCULAR | Status: DC
  Administered 2023-11-22: 1000 ug via INTRAMUSCULAR
  Filled 2023-11-18: qty 1

## 2023-11-18 MED ORDER — SODIUM CHLORIDE 0.9 % IV SOLN
50.0000 mg | Freq: Two times a day (BID) | INTRAVENOUS | Status: DC
  Administered 2023-11-18 – 2023-11-22 (×7): 50 mg via INTRAVENOUS
  Filled 2023-11-18 (×10): qty 5

## 2023-11-18 MED ORDER — CYANOCOBALAMIN 1000 MCG/ML IJ SOLN
1000.0000 ug | Freq: Every day | INTRAMUSCULAR | Status: AC
  Administered 2023-11-19 – 2023-11-21 (×3): 1000 ug via INTRAMUSCULAR
  Filled 2023-11-18 (×3): qty 1

## 2023-11-18 MED ORDER — HALOPERIDOL LACTATE 5 MG/ML IJ SOLN
2.0000 mg | Freq: Four times a day (QID) | INTRAMUSCULAR | Status: DC | PRN
  Administered 2023-11-19 – 2023-11-20 (×2): 2 mg via INTRAVENOUS
  Filled 2023-11-18 (×2): qty 1

## 2023-11-19 ENCOUNTER — Inpatient Hospital Stay (HOSPITAL_COMMUNITY): Payer: MEDICAID

## 2023-11-19 DIAGNOSIS — I639 Cerebral infarction, unspecified: Secondary | ICD-10-CM

## 2023-11-19 DIAGNOSIS — R609 Edema, unspecified: Secondary | ICD-10-CM

## 2023-11-19 DIAGNOSIS — M7989 Other specified soft tissue disorders: Secondary | ICD-10-CM

## 2023-11-19 DIAGNOSIS — R52 Pain, unspecified: Secondary | ICD-10-CM

## 2023-11-19 LAB — CBC WITH DIFFERENTIAL/PLATELET
Abs Immature Granulocytes: 0.02 10*3/uL (ref 0.00–0.07)
Basophils Absolute: 0 10*3/uL (ref 0.0–0.1)
Basophils Relative: 1 %
Eosinophils Absolute: 0.3 10*3/uL (ref 0.0–0.5)
Eosinophils Relative: 4 %
HCT: 41 % (ref 39.0–52.0)
Hemoglobin: 13.6 g/dL (ref 13.0–17.0)
Immature Granulocytes: 0 %
Lymphocytes Relative: 16 %
Lymphs Abs: 1.1 10*3/uL (ref 0.7–4.0)
MCH: 29.6 pg (ref 26.0–34.0)
MCHC: 33.2 g/dL (ref 30.0–36.0)
MCV: 89.3 fL (ref 80.0–100.0)
Monocytes Absolute: 0.9 10*3/uL (ref 0.1–1.0)
Monocytes Relative: 13 %
Neutro Abs: 4.7 10*3/uL (ref 1.7–7.7)
Neutrophils Relative %: 66 %
Platelets: 99 10*3/uL — ABNORMAL LOW (ref 150–400)
RBC: 4.59 MIL/uL (ref 4.22–5.81)
RDW: 13.6 % (ref 11.5–15.5)
WBC: 7 10*3/uL (ref 4.0–10.5)
nRBC: 0 % (ref 0.0–0.2)

## 2023-11-19 LAB — COMPREHENSIVE METABOLIC PANEL WITH GFR
ALT: 34 U/L (ref 0–44)
AST: 26 U/L (ref 15–41)
Albumin: 2.5 g/dL — ABNORMAL LOW (ref 3.5–5.0)
Alkaline Phosphatase: 61 U/L (ref 38–126)
Anion gap: 8 (ref 5–15)
BUN: 13 mg/dL (ref 6–20)
CO2: 23 mmol/L (ref 22–32)
Calcium: 8.5 mg/dL — ABNORMAL LOW (ref 8.9–10.3)
Chloride: 104 mmol/L (ref 98–111)
Creatinine, Ser: 1.09 mg/dL (ref 0.61–1.24)
GFR, Estimated: >60 mL/min (ref >60)
Glucose, Bld: 98 mg/dL (ref 70–99)
Potassium: 3.8 mmol/L (ref 3.5–5.1)
Sodium: 135 mmol/L (ref 135–145)
Total Bilirubin: 0.5 mg/dL (ref 0.0–1.2)
Total Protein: 5.8 g/dL — ABNORMAL LOW (ref 6.5–8.1)

## 2023-11-19 LAB — MAGNESIUM: Magnesium: 1.9 mg/dL (ref 1.7–2.4)

## 2023-11-19 LAB — C-REACTIVE PROTEIN: CRP: 8.9 mg/dL — ABNORMAL HIGH (ref <1.0)

## 2023-11-19 LAB — PROCALCITONIN: Procalcitonin: 0.1 ng/mL

## 2023-11-19 LAB — AMMONIA: Ammonia: 35 umol/L (ref 9–35)

## 2023-11-19 LAB — PHOSPHORUS: Phosphorus: 3 mg/dL (ref 2.5–4.6)

## 2023-11-20 DIAGNOSIS — G928 Other toxic encephalopathy: Secondary | ICD-10-CM

## 2023-11-20 LAB — COMPREHENSIVE METABOLIC PANEL WITH GFR
ALT: 39 U/L (ref 0–44)
AST: 31 U/L (ref 15–41)
Albumin: 2.7 g/dL — ABNORMAL LOW (ref 3.5–5.0)
Alkaline Phosphatase: 73 U/L (ref 38–126)
Anion gap: 7 (ref 5–15)
BUN: 13 mg/dL (ref 6–20)
CO2: 23 mmol/L (ref 22–32)
Calcium: 8.4 mg/dL — ABNORMAL LOW (ref 8.9–10.3)
Chloride: 104 mmol/L (ref 98–111)
Creatinine, Ser: 0.87 mg/dL (ref 0.61–1.24)
GFR, Estimated: >60 mL/min (ref >60)
Glucose, Bld: 105 mg/dL — ABNORMAL HIGH (ref 70–99)
Potassium: 3.6 mmol/L (ref 3.5–5.1)
Sodium: 134 mmol/L — ABNORMAL LOW (ref 135–145)
Total Bilirubin: 0.3 mg/dL (ref 0.0–1.2)
Total Protein: 5.6 g/dL — ABNORMAL LOW (ref 6.5–8.1)

## 2023-11-20 LAB — CBC WITH DIFFERENTIAL/PLATELET
Abs Immature Granulocytes: 0 10*3/uL (ref 0.00–0.07)
Basophils Absolute: 0.2 10*3/uL — ABNORMAL HIGH (ref 0.0–0.1)
Basophils Relative: 4 %
Eosinophils Absolute: 0.2 10*3/uL (ref 0.0–0.5)
Eosinophils Relative: 4 %
HCT: 39.2 % (ref 39.0–52.0)
Hemoglobin: 12.9 g/dL — ABNORMAL LOW (ref 13.0–17.0)
Lymphocytes Relative: 13 %
Lymphs Abs: 0.8 10*3/uL (ref 0.7–4.0)
MCH: 29.5 pg (ref 26.0–34.0)
MCHC: 32.9 g/dL (ref 30.0–36.0)
MCV: 89.7 fL (ref 80.0–100.0)
Monocytes Absolute: 0.6 10*3/uL (ref 0.1–1.0)
Monocytes Relative: 10 %
Neutro Abs: 4.3 10*3/uL (ref 1.7–7.7)
Neutrophils Relative %: 69 %
Platelets: 114 10*3/uL — ABNORMAL LOW (ref 150–400)
RBC: 4.37 MIL/uL (ref 4.22–5.81)
RDW: 13.3 % (ref 11.5–15.5)
WBC: 6.2 10*3/uL (ref 4.0–10.5)
nRBC: 0 % (ref 0.0–0.2)
nRBC: 0 /100{WBCs}

## 2023-11-20 LAB — MAGNESIUM: Magnesium: 2.1 mg/dL (ref 1.7–2.4)

## 2023-11-20 LAB — PROCALCITONIN: Procalcitonin: <0.1 ng/mL

## 2023-11-20 LAB — C-REACTIVE PROTEIN: CRP: 5.4 mg/dL — ABNORMAL HIGH (ref <1.0)

## 2023-11-20 LAB — AMMONIA: Ammonia: 61 umol/L — ABNORMAL HIGH (ref 9–35)

## 2023-11-20 LAB — PHOSPHORUS: Phosphorus: 2.6 mg/dL (ref 2.5–4.6)

## 2023-11-20 MED ORDER — QUETIAPINE FUMARATE 100 MG PO TABS
100.0000 mg | ORAL_TABLET | Freq: Every day | ORAL | Status: DC
  Administered 2023-11-20: 100 mg via ORAL
  Filled 2023-11-20: qty 1

## 2023-11-20 MED ORDER — CEPHALEXIN 500 MG PO CAPS
500.0000 mg | ORAL_CAPSULE | Freq: Three times a day (TID) | ORAL | Status: DC
  Administered 2023-11-20 – 2023-11-25 (×16): 500 mg via ORAL
  Filled 2023-11-20 (×17): qty 1

## 2023-11-20 MED ORDER — DOXYCYCLINE HYCLATE 100 MG PO TABS
100.0000 mg | ORAL_TABLET | Freq: Two times a day (BID) | ORAL | Status: DC
  Administered 2023-11-20 – 2023-11-25 (×11): 100 mg via ORAL
  Filled 2023-11-20 (×11): qty 1

## 2023-11-20 MED ORDER — QUETIAPINE FUMARATE 25 MG PO TABS
25.0000 mg | ORAL_TABLET | Freq: Every morning | ORAL | Status: DC
  Administered 2023-11-21: 25 mg via ORAL
  Filled 2023-11-20: qty 1

## 2023-11-21 DIAGNOSIS — I959 Hypotension, unspecified: Secondary | ICD-10-CM

## 2023-11-21 LAB — CBC WITH DIFFERENTIAL/PLATELET
Abs Immature Granulocytes: 0 10*3/uL (ref 0.00–0.07)
Basophils Absolute: 0 10*3/uL (ref 0.0–0.1)
Basophils Relative: 0 %
Eosinophils Absolute: 0.3 10*3/uL (ref 0.0–0.5)
Eosinophils Relative: 4 %
HCT: 41.3 % (ref 39.0–52.0)
Hemoglobin: 13.1 g/dL (ref 13.0–17.0)
Lymphocytes Relative: 25 %
Lymphs Abs: 1.6 10*3/uL (ref 0.7–4.0)
MCH: 29.3 pg (ref 26.0–34.0)
MCHC: 31.7 g/dL (ref 30.0–36.0)
MCV: 92.4 fL (ref 80.0–100.0)
Monocytes Absolute: 0.3 10*3/uL (ref 0.1–1.0)
Monocytes Relative: 5 %
Neutro Abs: 4.2 10*3/uL (ref 1.7–7.7)
Neutrophils Relative %: 66 %
Platelets: 139 10*3/uL — ABNORMAL LOW (ref 150–400)
RBC: 4.47 MIL/uL (ref 4.22–5.81)
RDW: 13.7 % (ref 11.5–15.5)
WBC: 6.4 10*3/uL (ref 4.0–10.5)
nRBC: 0 % (ref 0.0–0.2)
nRBC: 0 /100{WBCs}

## 2023-11-21 LAB — CULTURE, BLOOD (ROUTINE X 2)

## 2023-11-21 LAB — COMPREHENSIVE METABOLIC PANEL WITH GFR
ALT: 48 U/L — ABNORMAL HIGH (ref 0–44)
AST: 33 U/L (ref 15–41)
Albumin: 2.7 g/dL — ABNORMAL LOW (ref 3.5–5.0)
Alkaline Phosphatase: 72 U/L (ref 38–126)
Anion gap: 9 (ref 5–15)
BUN: 13 mg/dL (ref 6–20)
CO2: 21 mmol/L — ABNORMAL LOW (ref 22–32)
Calcium: 8.5 mg/dL — ABNORMAL LOW (ref 8.9–10.3)
Chloride: 107 mmol/L (ref 98–111)
Creatinine, Ser: 0.81 mg/dL (ref 0.61–1.24)
GFR, Estimated: >60 mL/min (ref >60)
Glucose, Bld: 94 mg/dL (ref 70–99)
Potassium: 4.4 mmol/L (ref 3.5–5.1)
Sodium: 137 mmol/L (ref 135–145)
Total Bilirubin: 0.5 mg/dL (ref 0.0–1.2)
Total Protein: 5.9 g/dL — ABNORMAL LOW (ref 6.5–8.1)

## 2023-11-21 LAB — AMMONIA: Ammonia: 39 umol/L — ABNORMAL HIGH (ref 9–35)

## 2023-11-21 LAB — PROCALCITONIN: Procalcitonin: <0.1 ng/mL

## 2023-11-21 MED ORDER — POTASSIUM CHLORIDE CRYS ER 20 MEQ PO TBCR
40.0000 meq | EXTENDED_RELEASE_TABLET | Freq: Once | ORAL | Status: AC
  Administered 2023-11-21: 40 meq via ORAL
  Filled 2023-11-21: qty 2

## 2023-11-21 MED ORDER — SODIUM CHLORIDE 0.9 % IV BOLUS: 1000.0000 mL | Freq: Once | INTRAVENOUS | Status: DC

## 2023-11-21 MED ORDER — QUETIAPINE FUMARATE 25 MG PO TABS
25.0000 mg | ORAL_TABLET | Freq: Every morning | ORAL | Status: DC
  Administered 2023-11-22: 25 mg via ORAL
  Filled 2023-11-21 (×2): qty 1

## 2023-11-21 MED ORDER — LORAZEPAM 1 MG PO TABS
1.0000 mg | ORAL_TABLET | ORAL | Status: DC | PRN
  Administered 2023-11-22: 2 mg via ORAL
  Filled 2023-11-21: qty 4

## 2023-11-21 MED ORDER — QUETIAPINE FUMARATE 50 MG PO TABS
150.0000 mg | ORAL_TABLET | Freq: Every day | ORAL | Status: DC
  Administered 2023-11-21 – 2023-11-22 (×2): 150 mg via ORAL
  Filled 2023-11-21 (×2): qty 1

## 2023-11-21 MED ORDER — SODIUM CHLORIDE 0.9 % IV BOLUS
1000.0000 mL | Freq: Once | INTRAVENOUS | Status: AC
  Administered 2023-11-21: 1000 mL via INTRAVENOUS

## 2023-11-21 MED ORDER — MIDODRINE HCL 5 MG PO TABS
10.0000 mg | ORAL_TABLET | Freq: Once | ORAL | Status: AC
  Administered 2023-11-21: 10 mg via ORAL
  Filled 2023-11-21: qty 2

## 2023-11-21 MED ORDER — SODIUM CHLORIDE 0.9 % IV SOLN: INTRAVENOUS | Status: AC

## 2023-11-21 MED ORDER — LORAZEPAM 2 MG/ML IJ SOLN
1.0000 mg | INTRAMUSCULAR | Status: DC | PRN
  Administered 2023-11-22: 2 mg via INTRAVENOUS
  Filled 2023-11-21: qty 2
  Filled 2023-11-21: qty 1

## 2023-11-22 DIAGNOSIS — F10931 Alcohol use, unspecified with withdrawal delirium: Secondary | ICD-10-CM

## 2023-11-22 LAB — CBC
HCT: 41.5 % (ref 39.0–52.0)
Hemoglobin: 13.5 g/dL (ref 13.0–17.0)
MCH: 29.5 pg (ref 26.0–34.0)
MCHC: 32.5 g/dL (ref 30.0–36.0)
MCV: 90.8 fL (ref 80.0–100.0)
Platelets: 143 10*3/uL — ABNORMAL LOW (ref 150–400)
RBC: 4.57 MIL/uL (ref 4.22–5.81)
RDW: 13.6 % (ref 11.5–15.5)
WBC: 8.6 10*3/uL (ref 4.0–10.5)
nRBC: 0 % (ref 0.0–0.2)

## 2023-11-22 LAB — BASIC METABOLIC PANEL WITH GFR
Anion gap: 7 (ref 5–15)
BUN: 9 mg/dL (ref 6–20)
CO2: 24 mmol/L (ref 22–32)
Calcium: 9 mg/dL (ref 8.9–10.3)
Chloride: 109 mmol/L (ref 98–111)
Creatinine, Ser: 0.76 mg/dL (ref 0.61–1.24)
GFR, Estimated: >60 mL/min (ref >60)
Glucose, Bld: 96 mg/dL (ref 70–99)
Potassium: 4.6 mmol/L (ref 3.5–5.1)
Sodium: 140 mmol/L (ref 135–145)

## 2023-11-22 LAB — MAGNESIUM: Magnesium: 2.2 mg/dL (ref 1.7–2.4)

## 2023-11-22 LAB — FOLATE: Folate: 11.3 ng/mL (ref >5.9)

## 2023-11-22 LAB — VITAMIN B12: Vitamin B-12: >7500 pg/mL — ABNORMAL HIGH (ref 180–914)

## 2023-11-22 LAB — PHOSPHORUS: Phosphorus: 2.2 mg/dL — ABNORMAL LOW (ref 2.5–4.6)

## 2023-11-22 MED ORDER — THIAMINE MONONITRATE 100 MG PO TABS
100.0000 mg | ORAL_TABLET | Freq: Every day | ORAL | Status: DC
  Administered 2023-11-22 – 2023-11-25 (×3): 100 mg via ORAL
  Filled 2023-11-22 (×4): qty 1

## 2023-11-22 MED ORDER — LORAZEPAM 2 MG/ML IJ SOLN: 2.0000 mg | INTRAMUSCULAR | Status: DC | PRN

## 2023-11-22 MED ORDER — LACOSAMIDE 50 MG PO TABS
50.0000 mg | ORAL_TABLET | Freq: Two times a day (BID) | ORAL | Status: DC
  Administered 2023-11-23 – 2023-11-25 (×5): 50 mg via ORAL
  Filled 2023-11-22 (×5): qty 1

## 2023-11-22 MED ORDER — ZIPRASIDONE MESYLATE 20 MG IM SOLR
10.0000 mg | Freq: Once | INTRAMUSCULAR | Status: DC | PRN
  Filled 2023-11-22: qty 20

## 2023-11-22 MED ORDER — LACOSAMIDE 50 MG PO TABS
50.0000 mg | ORAL_TABLET | Freq: Once | ORAL | Status: AC
  Administered 2023-11-22: 50 mg via ORAL
  Filled 2023-11-22: qty 1

## 2023-11-22 MED ORDER — FOLIC ACID 1 MG PO TABS
1.0000 mg | ORAL_TABLET | Freq: Every day | ORAL | Status: DC
  Administered 2023-11-24: 1 mg via ORAL
  Filled 2023-11-22: qty 1

## 2023-11-22 MED ORDER — LORAZEPAM 2 MG/ML IJ SOLN
1.0000 mg | INTRAMUSCULAR | Status: DC
  Administered 2023-11-22 – 2023-11-23 (×2): 1 mg via INTRAMUSCULAR
  Filled 2023-11-22 (×3): qty 1

## 2023-11-22 MED ORDER — ZIPRASIDONE MESYLATE 20 MG IM SOLR: 20.0000 mg | Freq: Once | INTRAMUSCULAR | Status: DC | PRN

## 2023-11-22 MED ORDER — HALOPERIDOL LACTATE 5 MG/ML IJ SOLN: 5.0000 mg | Freq: Four times a day (QID) | INTRAMUSCULAR | Status: DC | PRN

## 2023-11-22 MED ORDER — PANTOPRAZOLE SODIUM 40 MG PO TBEC
40.0000 mg | DELAYED_RELEASE_TABLET | Freq: Every day | ORAL | Status: DC
  Administered 2023-11-22 – 2023-11-24 (×3): 40 mg via ORAL
  Filled 2023-11-22 (×3): qty 1

## 2023-11-22 MED ADMIN — Ziprasidone Mesylate For Inj 20 MG (Base Equivalent): 20 mg | INTRAMUSCULAR | NDC 72205020507

## 2023-11-22 MED FILL — Ziprasidone Mesylate For Inj 20 MG (Base Equivalent): 20.0000 mg | INTRAMUSCULAR | Qty: 20 | Status: AC

## 2023-11-23 LAB — CBC
HCT: 40.5 % (ref 39.0–52.0)
Hemoglobin: 13 g/dL (ref 13.0–17.0)
MCH: 29.6 pg (ref 26.0–34.0)
MCHC: 32.1 g/dL (ref 30.0–36.0)
MCV: 92.3 fL (ref 80.0–100.0)
Platelets: 198 10*3/uL (ref 150–400)
RBC: 4.39 MIL/uL (ref 4.22–5.81)
RDW: 14 % (ref 11.5–15.5)
WBC: 8.2 10*3/uL (ref 4.0–10.5)
nRBC: 0 % (ref 0.0–0.2)

## 2023-11-23 LAB — BASIC METABOLIC PANEL WITH GFR
Anion gap: 12 (ref 5–15)
BUN: 15 mg/dL (ref 6–20)
CO2: 21 mmol/L — ABNORMAL LOW (ref 22–32)
Calcium: 8.8 mg/dL — ABNORMAL LOW (ref 8.9–10.3)
Chloride: 105 mmol/L (ref 98–111)
Creatinine, Ser: 0.81 mg/dL (ref 0.61–1.24)
GFR, Estimated: >60 mL/min (ref >60)
Glucose, Bld: 93 mg/dL (ref 70–99)
Potassium: 4.8 mmol/L (ref 3.5–5.1)
Sodium: 138 mmol/L (ref 135–145)

## 2023-11-23 LAB — PATHOLOGIST SMEAR REVIEW

## 2023-11-23 LAB — PHOSPHORUS: Phosphorus: 4.3 mg/dL (ref 2.5–4.6)

## 2023-11-23 LAB — MAGNESIUM: Magnesium: 2.1 mg/dL (ref 1.7–2.4)

## 2023-11-23 MED ORDER — QUETIAPINE FUMARATE 100 MG PO TABS: 200.0000 mg | ORAL_TABLET | Freq: Every day | ORAL | Status: DC

## 2023-11-23 MED ORDER — LORAZEPAM 2 MG/ML IJ SOLN: 1.0000 mg | Freq: Three times a day (TID) | INTRAMUSCULAR | Status: DC

## 2023-11-23 MED ORDER — CHLORPROMAZINE HCL 25 MG/ML IJ SOLN
100.0000 mg | Freq: Every day | INTRAMUSCULAR | Status: DC
  Filled 2023-11-23 (×3): qty 4

## 2023-11-23 MED ORDER — CHLORPROMAZINE HCL 25 MG/ML IJ SOLN
100.0000 mg | Freq: Every day | INTRAMUSCULAR | Status: DC
  Filled 2023-11-23: qty 4

## 2023-11-23 MED ORDER — QUETIAPINE FUMARATE 100 MG PO TABS
100.0000 mg | ORAL_TABLET | Freq: Two times a day (BID) | ORAL | Status: DC
  Administered 2023-11-24 – 2023-11-25 (×2): 100 mg via ORAL
  Filled 2023-11-23 (×4): qty 1

## 2023-11-23 MED ORDER — CHLORPROMAZINE HCL 25 MG/ML IJ SOLN
50.0000 mg | Freq: Two times a day (BID) | INTRAMUSCULAR | Status: DC
  Filled 2023-11-23 (×4): qty 2

## 2023-11-23 MED ORDER — LORAZEPAM 1 MG PO TABS
1.0000 mg | ORAL_TABLET | Freq: Three times a day (TID) | ORAL | Status: DC
  Administered 2023-11-23 – 2023-11-25 (×5): 1 mg via ORAL
  Filled 2023-11-23 (×5): qty 1

## 2023-11-23 MED ORDER — QUETIAPINE FUMARATE 100 MG PO TABS
200.0000 mg | ORAL_TABLET | Freq: Every day | ORAL | Status: DC
  Administered 2023-11-23 – 2023-11-24 (×2): 200 mg via ORAL
  Filled 2023-11-23: qty 8
  Filled 2023-11-23: qty 2

## 2023-11-23 MED ORDER — ZIPRASIDONE MESYLATE 20 MG IM SOLR
20.0000 mg | Freq: Two times a day (BID) | INTRAMUSCULAR | Status: DC | PRN
  Filled 2023-11-23: qty 20

## 2023-11-24 LAB — BASIC METABOLIC PANEL WITH GFR
Anion gap: 8 (ref 5–15)
BUN: 13 mg/dL (ref 6–20)
CO2: 25 mmol/L (ref 22–32)
Calcium: 9 mg/dL (ref 8.9–10.3)
Chloride: 106 mmol/L (ref 98–111)
Creatinine, Ser: 0.73 mg/dL (ref 0.61–1.24)
GFR, Estimated: >60 mL/min (ref >60)
Glucose, Bld: 108 mg/dL — ABNORMAL HIGH (ref 70–99)
Potassium: 4 mmol/L (ref 3.5–5.1)
Sodium: 139 mmol/L (ref 135–145)

## 2023-11-24 LAB — CBC
HCT: 39.5 % (ref 39.0–52.0)
Hemoglobin: 12.7 g/dL — ABNORMAL LOW (ref 13.0–17.0)
MCH: 29.6 pg (ref 26.0–34.0)
MCHC: 32.2 g/dL (ref 30.0–36.0)
MCV: 92.1 fL (ref 80.0–100.0)
Platelets: 217 10*3/uL (ref 150–400)
RBC: 4.29 MIL/uL (ref 4.22–5.81)
RDW: 13.9 % (ref 11.5–15.5)
WBC: 9 10*3/uL (ref 4.0–10.5)
nRBC: 0 % (ref 0.0–0.2)

## 2023-11-24 LAB — MAGNESIUM: Magnesium: 2 mg/dL (ref 1.7–2.4)

## 2023-11-24 LAB — PHOSPHORUS: Phosphorus: 4.1 mg/dL (ref 2.5–4.6)

## 2023-11-24 MED ORDER — OXYCODONE HCL 5 MG PO TABS
5.0000 mg | ORAL_TABLET | Freq: Four times a day (QID) | ORAL | Status: DC | PRN
  Administered 2023-11-24 – 2023-11-25 (×4): 5 mg via ORAL
  Filled 2023-11-24 (×4): qty 1

## 2023-11-25 ENCOUNTER — Other Ambulatory Visit (HOSPITAL_COMMUNITY): Payer: Self-pay

## 2023-11-25 LAB — BASIC METABOLIC PANEL WITH GFR
Anion gap: 9 (ref 5–15)
BUN: 12 mg/dL (ref 6–20)
CO2: 23 mmol/L (ref 22–32)
Calcium: 8.7 mg/dL — ABNORMAL LOW (ref 8.9–10.3)
Chloride: 104 mmol/L (ref 98–111)
Creatinine, Ser: 0.98 mg/dL (ref 0.61–1.24)
GFR, Estimated: >60 mL/min (ref >60)
Glucose, Bld: 102 mg/dL — ABNORMAL HIGH (ref 70–99)
Potassium: 3.8 mmol/L (ref 3.5–5.1)
Sodium: 136 mmol/L (ref 135–145)

## 2023-11-25 LAB — MAGNESIUM: Magnesium: 1.9 mg/dL (ref 1.7–2.4)

## 2023-11-25 LAB — CBC
HCT: 40.5 % (ref 39.0–52.0)
Hemoglobin: 12.9 g/dL — ABNORMAL LOW (ref 13.0–17.0)
MCH: 28.9 pg (ref 26.0–34.0)
MCHC: 31.9 g/dL (ref 30.0–36.0)
MCV: 90.8 fL (ref 80.0–100.0)
Platelets: 227 10*3/uL (ref 150–400)
RBC: 4.46 MIL/uL (ref 4.22–5.81)
RDW: 13.9 % (ref 11.5–15.5)
WBC: 8.7 10*3/uL (ref 4.0–10.5)
nRBC: 0 % (ref 0.0–0.2)

## 2023-11-25 LAB — PHOSPHORUS: Phosphorus: 4.2 mg/dL (ref 2.5–4.6)

## 2023-11-25 MED ORDER — QUETIAPINE FUMARATE 100 MG PO TABS
300.0000 mg | ORAL_TABLET | Freq: Every day | ORAL | 1 refills | Status: AC
  Filled 2023-11-25: qty 90, 30d supply, fill #0

## 2023-11-25 MED ORDER — TAMSULOSIN HCL 0.4 MG PO CAPS
0.4000 mg | ORAL_CAPSULE | Freq: Every day | ORAL | 1 refills | Status: AC
  Filled 2023-11-25: qty 30, 30d supply, fill #0

## 2023-11-25 MED ORDER — ADULT MULTIVITAMIN W/MINERALS CH
1.0000 | ORAL_TABLET | Freq: Every day | ORAL | 0 refills | Status: AC
  Filled 2023-11-25: qty 30, 30d supply, fill #0

## 2023-11-25 MED ORDER — CEPHALEXIN 500 MG PO CAPS
500.0000 mg | ORAL_CAPSULE | Freq: Three times a day (TID) | ORAL | 0 refills | Status: AC
  Filled 2023-11-25: qty 5, 2d supply, fill #0

## 2023-11-25 MED ORDER — LORAZEPAM 1 MG PO TABS
ORAL_TABLET | ORAL | 0 refills | Status: AC
  Filled 2023-11-25: qty 5, 3d supply, fill #0

## 2023-11-25 MED ORDER — THIAMINE HCL 100 MG PO TABS
100.0000 mg | ORAL_TABLET | Freq: Every day | ORAL | 0 refills | Status: AC
  Filled 2023-11-25: qty 30, 30d supply, fill #0

## 2023-11-25 MED ORDER — ASPIRIN 81 MG PO CHEW: 81.0000 mg | CHEWABLE_TABLET | Freq: Every day | ORAL | 0 refills | Status: AC

## 2023-11-25 MED ORDER — PANTOPRAZOLE SODIUM 40 MG PO TBEC
40.0000 mg | DELAYED_RELEASE_TABLET | Freq: Every day | ORAL | 1 refills | Status: AC
  Filled 2023-11-25: qty 30, 30d supply, fill #0

## 2023-11-25 MED ORDER — ROSUVASTATIN CALCIUM 10 MG PO TABS
10.0000 mg | ORAL_TABLET | Freq: Every day | ORAL | 1 refills | Status: AC
  Filled 2023-11-25: qty 30, 30d supply, fill #0

## 2023-11-25 MED ORDER — LACOSAMIDE 50 MG PO TABS
50.0000 mg | ORAL_TABLET | Freq: Two times a day (BID) | ORAL | 1 refills | Status: AC
  Filled 2023-11-25: qty 60, 30d supply, fill #0

## 2023-11-25 MED FILL — Doxycycline Hyclate Tab 100 MG: 100.0000 mg | ORAL | 2 days supply | Qty: 3 | Fill #0 | Status: AC

## 2023-12-10 ENCOUNTER — Encounter: Payer: Self-pay | Admitting: Internal Medicine

## 2023-12-10 LAB — VITAMIN B1: Vitamin B1 (Thiamine): 237.8 nmol/L — ABNORMAL HIGH (ref 66.5–200.0)

## 2023-12-24 ENCOUNTER — Other Ambulatory Visit (HOSPITAL_COMMUNITY): Payer: Self-pay

## 2023-12-31 ENCOUNTER — Ambulatory Visit: Payer: Medicaid Other | Admitting: Neurology

## 2024-01-29 ENCOUNTER — Ambulatory Visit: Payer: Self-pay | Admitting: Nurse Practitioner

## 2024-02-09 ENCOUNTER — Encounter: Payer: Self-pay | Admitting: *Deleted

## 2024-04-04 DIAGNOSIS — G40909 Epilepsy, unspecified, not intractable, without status epilepticus: Secondary | ICD-10-CM

## 2024-04-13 ENCOUNTER — Other Ambulatory Visit: Payer: Self-pay | Admitting: Neurology

## 2024-04-13 ENCOUNTER — Encounter (INDEPENDENT_AMBULATORY_CARE_PROVIDER_SITE_OTHER): Payer: Self-pay | Admitting: Neurology

## 2024-04-13 DIAGNOSIS — G40909 Epilepsy, unspecified, not intractable, without status epilepticus: Secondary | ICD-10-CM
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
## 2024-04-15 ENCOUNTER — Ambulatory Visit: Payer: Self-pay | Admitting: Neurology

## 2024-04-25 NOTE — Telephone Encounter (Signed)
 Pt called to request a call back about results Pt would like a call .

## 2024-04-28 NOTE — Progress Notes (Signed)
 Yes, normal result, no seizures, no abnormal discharges.

## 2024-04-28 NOTE — Telephone Encounter (Addendum)
 Called pt and let him know the below Results. Pt verbalized understanding and Thanked me for calling.   ----- Message from [facility] sent at 04/28/2024  9:10 AM EST ----- Yes, normal result, no seizures, no abnormal discharges.  ----- Message ----- From: Izell Briant SAUNDERS, CMA Sent: 04/28/2024   9:06 AM EST To: Pastor Falling, MD  Is the Result Normal

## 2024-08-17 ENCOUNTER — Ambulatory Visit: Admitting: Neurology
